# Patient Record
Sex: Female | Born: 1980 | Race: Black or African American | Hispanic: No | Marital: Married | Smoking: Former smoker
Health system: Southern US, Community
[De-identification: ages and names within clinical notes are randomized; demographics above are authoritative.]

## PROBLEM LIST (undated history)

## (undated) ENCOUNTER — Ambulatory Visit: Admission: EM | Payer: Medicaid Other | Source: Home / Self Care

## (undated) DIAGNOSIS — I10 Essential (primary) hypertension: Secondary | ICD-10-CM

---

## 2013-06-30 ENCOUNTER — Emergency Department: Payer: Self-pay | Admitting: Emergency Medicine

## 2013-06-30 LAB — BASIC METABOLIC PANEL
BUN: 16 mg/dL (ref 7–18)
Calcium, Total: 8.7 mg/dL (ref 8.5–10.1)
Co2: 22 mmol/L (ref 21–32)
EGFR (Non-African Amer.): >60
Glucose: 95 mg/dL (ref 65–99)
Potassium: 3 mmol/L — ABNORMAL LOW (ref 3.5–5.1)
Sodium: 142 mmol/L (ref 136–145)

## 2013-06-30 LAB — CBC
HCT: 44.1 % (ref 35.0–47.0)
RBC: 4.95 10*6/uL (ref 3.80–5.20)
RDW: 13.6 % (ref 11.5–14.5)

## 2013-07-09 ENCOUNTER — Emergency Department: Payer: Self-pay | Admitting: Emergency Medicine

## 2013-07-09 LAB — CBC
HCT: 42.9 % (ref 35.0–47.0)
HGB: 14.3 g/dL (ref 12.0–16.0)
Platelet: 296 10*3/uL (ref 150–440)
RBC: 4.81 10*6/uL (ref 3.80–5.20)
RDW: 13.4 % (ref 11.5–14.5)
WBC: 7.6 10*3/uL (ref 3.6–11.0)

## 2013-07-09 LAB — BASIC METABOLIC PANEL
Anion Gap: 7 (ref 7–16)
BUN: 14 mg/dL (ref 7–18)
Calcium, Total: 8.9 mg/dL (ref 8.5–10.1)
Chloride: 109 mmol/L — ABNORMAL HIGH (ref 98–107)
Co2: 24 mmol/L (ref 21–32)
EGFR (African American): >60
Glucose: 100 mg/dL — ABNORMAL HIGH (ref 65–99)
Potassium: 3.4 mmol/L — ABNORMAL LOW (ref 3.5–5.1)
Sodium: 140 mmol/L (ref 136–145)

## 2013-07-09 LAB — URINALYSIS, COMPLETE
Glucose,UR: NEGATIVE mg/dL (ref 0–75)
Ketone: NEGATIVE
Protein: NEGATIVE
Specific Gravity: 1.02 (ref 1.003–1.030)
WBC UR: 14 /HPF (ref 0–5)

## 2013-07-10 LAB — URINE CULTURE

## 2014-02-01 IMAGING — CR DG CHEST 1V PORT
1 series · 1 of 1 positions shown · non-contrast
Comparison: None.

CLINICAL DATA: Palpitations

EXAM:
PORTABLE CHEST - 1 VIEW

[ap]
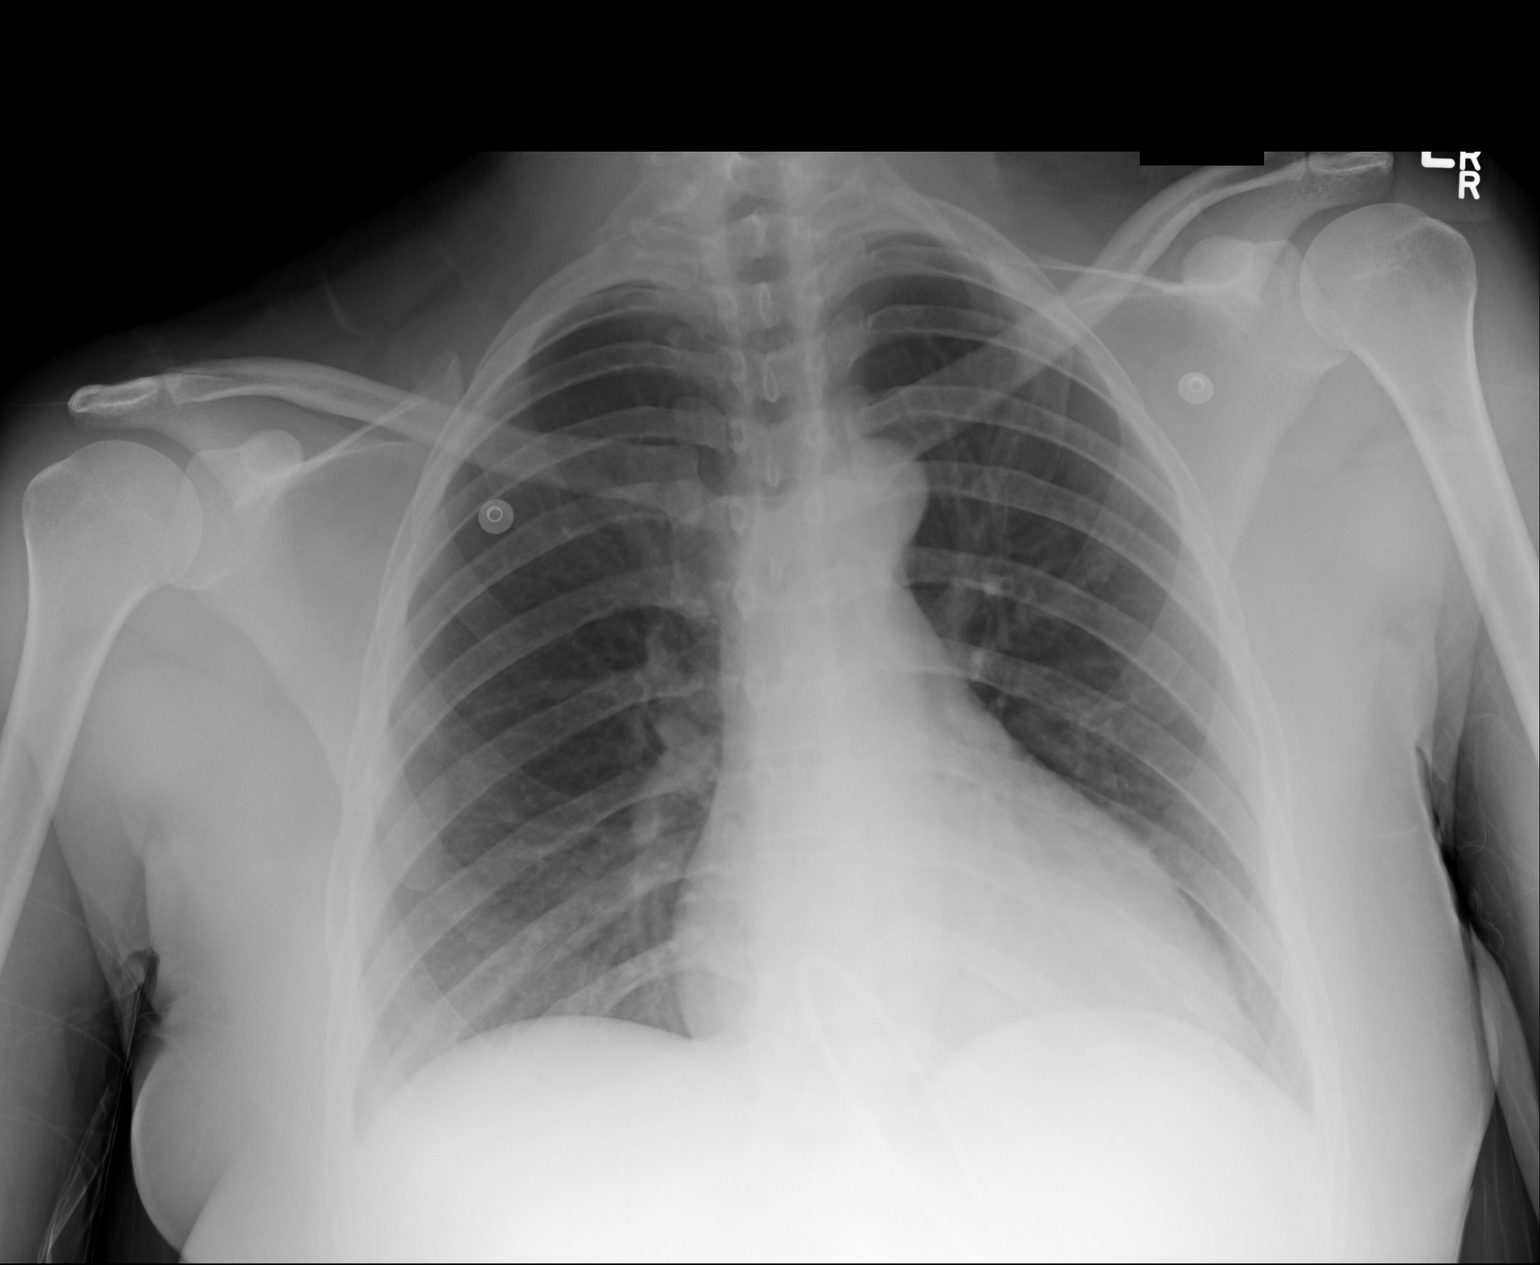

[1 of 1 positions shown; findings below may reference images not displayed]

FINDINGS: The cardiac and mediastinal silhouettes are within normal limits.

The lungs are normally inflated. No airspace consolidation, pleural
effusion, or pulmonary edema is identified. There is no
pneumothorax.

No acute osseous abnormality identified.
IMPRESSION: Normal radiograph of the chest with no acute cardiopulmonary process
identified.

## 2014-02-26 ENCOUNTER — Emergency Department: Payer: Self-pay | Admitting: Emergency Medicine

## 2014-05-12 ENCOUNTER — Emergency Department: Payer: Self-pay | Admitting: Emergency Medicine

## 2014-07-07 ENCOUNTER — Emergency Department: Payer: Self-pay | Admitting: Emergency Medicine

## 2014-08-11 ENCOUNTER — Emergency Department: Payer: Self-pay | Admitting: Emergency Medicine

## 2014-08-11 LAB — CBC WITH DIFFERENTIAL/PLATELET
Basophil #: 0.1 10*3/uL (ref 0.0–0.1)
Basophil %: 0.8 %
EOS ABS: 0.6 10*3/uL (ref 0.0–0.7)
Eosinophil %: 6.4 %
HCT: 44.8 % (ref 35.0–47.0)
HGB: 14.8 g/dL (ref 12.0–16.0)
Lymphocyte #: 4.5 10*3/uL — ABNORMAL HIGH (ref 1.0–3.6)
Lymphocyte %: 49.1 %
MCH: 29.3 pg (ref 26.0–34.0)
MCHC: 33 g/dL (ref 32.0–36.0)
MCV: 89 fL (ref 80–100)
MONO ABS: 0.6 x10 3/mm (ref 0.2–0.9)
Monocyte %: 7 %
NEUTROS PCT: 36.7 %
Neutrophil #: 3.4 10*3/uL (ref 1.4–6.5)
Platelet: 378 10*3/uL (ref 150–440)
RBC: 5.05 10*6/uL (ref 3.80–5.20)
RDW: 13.1 % (ref 11.5–14.5)
WBC: 9.2 10*3/uL (ref 3.6–11.0)

## 2014-08-11 LAB — URINALYSIS, COMPLETE
BILIRUBIN, UR: NEGATIVE
Bacteria: NONE SEEN
Blood: NEGATIVE
Glucose,UR: NEGATIVE mg/dL (ref 0–75)
KETONE: NEGATIVE
Leukocyte Esterase: NEGATIVE
Nitrite: NEGATIVE
PROTEIN: NEGATIVE
Ph: 5 (ref 4.5–8.0)
RBC,UR: <1 /HPF (ref 0–5)
Specific Gravity: 1.023 (ref 1.003–1.030)
Squamous Epithelial: 3

## 2014-08-11 LAB — COMPREHENSIVE METABOLIC PANEL
ALBUMIN: 3.9 g/dL (ref 3.4–5.0)
ALT: 17 U/L (ref 14–63)
AST: 21 U/L (ref 15–37)
Alkaline Phosphatase: 61 U/L (ref 46–116)
Anion Gap: 6 — ABNORMAL LOW (ref 7–16)
BUN: 11 mg/dL (ref 7–18)
Bilirubin,Total: 0.4 mg/dL (ref 0.2–1.0)
Calcium, Total: 9.7 mg/dL (ref 8.5–10.1)
Chloride: 101 mmol/L (ref 98–107)
Co2: 31 mmol/L (ref 21–32)
Creatinine: 0.92 mg/dL (ref 0.60–1.30)
EGFR (African American): >60
EGFR (Non-African Amer.): >60
Glucose: 100 mg/dL — ABNORMAL HIGH (ref 65–99)
Osmolality: 275 (ref 275–301)
Potassium: 2.8 mmol/L — ABNORMAL LOW (ref 3.5–5.1)
Sodium: 138 mmol/L (ref 136–145)
Total Protein: 8.6 g/dL — ABNORMAL HIGH (ref 6.4–8.2)

## 2014-08-11 LAB — DRUG SCREEN, URINE
AMPHETAMINES, UR SCREEN: NEGATIVE (ref ?–1000)
Barbiturates, Ur Screen: NEGATIVE (ref ?–200)
Benzodiazepine, Ur Scrn: NEGATIVE (ref ?–200)
COCAINE METABOLITE, UR ~~LOC~~: NEGATIVE (ref ?–300)
Cannabinoid 50 Ng, Ur ~~LOC~~: NEGATIVE (ref ?–50)
MDMA (ECSTASY) UR SCREEN: NEGATIVE (ref ?–500)
METHADONE, UR SCREEN: NEGATIVE (ref ?–300)
OPIATE, UR SCREEN: NEGATIVE (ref ?–300)
Phencyclidine (PCP) Ur S: NEGATIVE (ref ?–25)
Tricyclic, Ur Screen: NEGATIVE (ref ?–1000)

## 2014-08-11 LAB — CK TOTAL AND CKMB (NOT AT ARMC)
CK, Total: 135 U/L (ref 26–192)
CK-MB: <0.5 ng/mL — ABNORMAL LOW (ref 0.5–3.6)

## 2014-08-11 LAB — TROPONIN I

## 2014-08-11 LAB — TSH: Thyroid Stimulating Horm: 4.19 u[IU]/mL

## 2014-12-31 ENCOUNTER — Encounter: Payer: Self-pay | Admitting: Emergency Medicine

## 2014-12-31 ENCOUNTER — Emergency Department: Payer: Medicaid Other

## 2014-12-31 ENCOUNTER — Emergency Department
Admission: EM | Admit: 2014-12-31 | Discharge: 2014-12-31 | Disposition: A | Discharge status: Home / Self Care | Payer: Medicaid Other | Attending: Emergency Medicine | Admitting: Emergency Medicine

## 2014-12-31 DIAGNOSIS — O10011 Pre-existing essential hypertension complicating pregnancy, first trimester: Secondary | ICD-10-CM | POA: Diagnosis not present

## 2014-12-31 DIAGNOSIS — O9989 Other specified diseases and conditions complicating pregnancy, childbirth and the puerperium: Secondary | ICD-10-CM | POA: Diagnosis present

## 2014-12-31 DIAGNOSIS — R197 Diarrhea, unspecified: Secondary | ICD-10-CM | POA: Insufficient documentation

## 2014-12-31 DIAGNOSIS — Z880 Allergy status to penicillin: Secondary | ICD-10-CM | POA: Insufficient documentation

## 2014-12-31 DIAGNOSIS — Z3490 Encounter for supervision of normal pregnancy, unspecified, unspecified trimester: Secondary | ICD-10-CM

## 2014-12-31 DIAGNOSIS — Z3A01 Less than 8 weeks gestation of pregnancy: Secondary | ICD-10-CM | POA: Diagnosis not present

## 2014-12-31 DIAGNOSIS — R1030 Lower abdominal pain, unspecified: Secondary | ICD-10-CM | POA: Diagnosis not present

## 2014-12-31 HISTORY — DX: Essential (primary) hypertension: I10

## 2014-12-31 LAB — CBC WITH DIFFERENTIAL/PLATELET
Basophils Absolute: 0 10*3/uL (ref 0–0.1)
Basophils Relative: 1 %
Eosinophils Absolute: 0.2 10*3/uL (ref 0–0.7)
Eosinophils Relative: 3 %
HEMATOCRIT: 42.9 % (ref 35.0–47.0)
HEMOGLOBIN: 14.2 g/dL (ref 12.0–16.0)
Lymphocytes Relative: 39 %
Lymphs Abs: 2.5 10*3/uL (ref 1.0–3.6)
MCH: 29.8 pg (ref 26.0–34.0)
MCHC: 33 g/dL (ref 32.0–36.0)
MCV: 90.4 fL (ref 80.0–100.0)
Monocytes Absolute: 0.5 10*3/uL (ref 0.2–0.9)
Monocytes Relative: 7 %
NEUTROS ABS: 3.2 10*3/uL (ref 1.4–6.5)
Neutrophils Relative %: 50 %
Platelets: 318 10*3/uL (ref 150–440)
RBC: 4.75 MIL/uL (ref 3.80–5.20)
RDW: 13.8 % (ref 11.5–14.5)
WBC: 6.4 10*3/uL (ref 3.6–11.0)

## 2014-12-31 LAB — COMPREHENSIVE METABOLIC PANEL
ALT: 12 U/L — ABNORMAL LOW (ref 14–54)
AST: 16 U/L (ref 15–41)
Albumin: 4 g/dL (ref 3.5–5.0)
Alkaline Phosphatase: 37 U/L — ABNORMAL LOW (ref 38–126)
Anion gap: 6 (ref 5–15)
BILIRUBIN TOTAL: 0.7 mg/dL (ref 0.3–1.2)
BUN: 11 mg/dL (ref 6–20)
CALCIUM: 8.9 mg/dL (ref 8.9–10.3)
CO2: 24 mmol/L (ref 22–32)
Chloride: 107 mmol/L (ref 101–111)
Creatinine, Ser: 0.71 mg/dL (ref 0.44–1.00)
GFR calc Af Amer: >60 mL/min (ref >60)
GFR calc non Af Amer: >60 mL/min (ref >60)
Glucose, Bld: 97 mg/dL (ref 65–99)
Potassium: 3.4 mmol/L — ABNORMAL LOW (ref 3.5–5.1)
SODIUM: 137 mmol/L (ref 135–145)
Total Protein: 7.6 g/dL (ref 6.5–8.1)

## 2014-12-31 LAB — POCT PREGNANCY, URINE: PREG TEST UR: POSITIVE — AB

## 2014-12-31 LAB — HCG, QUANTITATIVE, PREGNANCY: hCG, Beta Chain, Quant, S: 134 m[IU]/mL — ABNORMAL HIGH (ref <5)

## 2014-12-31 NOTE — Discharge Instructions (Signed)
Abdominal Pain During Pregnancy Abdominal pain is common in pregnancy. Most of the time, it does not cause harm. There are many causes of abdominal pain. Some causes are more serious than others. Some of the causes of abdominal pain in pregnancy are easily diagnosed. Occasionally, the diagnosis takes time to understand. Other times, the cause is not determined. Abdominal pain can be a sign that something is very wrong with the pregnancy, or the pain may have nothing to do with the pregnancy at all. For this reason, always tell your health care provider if you have any abdominal discomfort. HOME CARE INSTRUCTIONS  Monitor your abdominal pain for any changes. The following actions may help to alleviate any discomfort you are experiencing:  Do not have sexual intercourse or put anything in your vagina until your symptoms go away completely.  Get plenty of rest until your pain improves.  Drink clear fluids if you feel nauseous. Avoid solid food as long as you are uncomfortable or nauseous.  Only take over-the-counter or prescription medicine as directed by your health care provider.  Keep all follow-up appointments with your health care provider. SEEK IMMEDIATE MEDICAL CARE IF:  You are bleeding, leaking fluid, or passing tissue from the vagina.  You have increasing pain or cramping.  You have persistent vomiting.  You have painful or bloody urination.  You have a fever.  You notice a decrease in your baby's movements.  You have extreme weakness or feel faint.  You have shortness of breath, with or without abdominal pain.  You develop a severe headache with abdominal pain.  You have abnormal vaginal discharge with abdominal pain.  You have persistent diarrhea.  You have abdominal pain that continues even after rest, or gets worse. MAKE SURE YOU:   Understand these instructions.  Will watch your condition.  Will get help right away if you are not doing well or get  worse. Document Released: 06/30/2005 Document Revised: 04/20/2013 Document Reviewed: 01/27/2013 [facility] Patient Information 2015 [place], [place]. This information is not intended to replace advice given to you by your health care provider. Make sure you discuss any questions you have with your health care provider.      As we have discussed your pregnancy test has resulted as positive, but her ultrasound does not show a pregnancy currently. This could be either a very early normal pregnancy, a miscarriage or possibly an ectopic pregnancy. It is extremely important he follow-up with your OB/GYN within 1-2 weeks for recheck and re-ultrasound. Please return to the emergency department for any increased pain, bleeding, or any other personally concerning symptoms.

## 2014-12-31 NOTE — ED Provider Notes (Signed)
[facility] Emergency Department Provider Note  Time seen: 10:12 AM  I have reviewed the triage vital signs and the nursing notes.   HISTORY  Chief Complaint Pelvic Pain    HPI Christine Young is a 34 y.o. female with a past medical history of hypertension presents the emergency department with lower abdominal pain since this morning. According to the patient she's had lower abdominal pain, on both sides of her lower belly since 7 AM this morning. She also states intermittent diarrhea for the past 2 days. Denies any bloody or black diarrhea area denies nausea or vomiting. Denies fever. Patient does state her last period was at the beginning of May and she is about 2 weeks late at this point.Denies any vaginal bleeding, or vaginal discharge. Patient states her pain is mild/moderate in severity, no modifying factors identified.     Past Medical History  Diagnosis Date  . Hypertension     There are no active problems to display for this patient.   History reviewed. No pertinent past surgical history.  No current outpatient prescriptions on file.  Allergies Penicillins  No family history on file.  Social History History  Substance Use Topics  . Smoking status: Never Smoker   . Smokeless tobacco: Not on file  . Alcohol Use: Yes     Comment: occasionally    Review of Systems Constitutional: Negative for fever. Cardiovascular: Negative for chest pain. Respiratory: Negative for shortness of breath. Gastrointestinal: Positive for lower abdominal pain. Negative for nausea/vomiting. Positive for diarrhea 2 days. Genitourinary: Negative for dysuria. Negative for vaginal bleeding or discharge. Musculoskeletal: Negative for back pain.  10-point ROS otherwise negative.  ____________________________________________   PHYSICAL EXAM:  VITAL SIGNS: ED Triage Vitals  Enc Vitals Group     BP 12/31/14 0859 146/83 mmHg     Pulse Rate 12/31/14 0859 72    Resp 12/31/14 0859 20     Temp 12/31/14 0859 98.8 F (37.1 C)     Temp Source 12/31/14 0859 Oral     SpO2 12/31/14 0859 99 %     Weight 12/31/14 0859 192 lb (87.091 kg)     Height 12/31/14 0859  (1.702 m)     Head Cir --      Peak Flow --      Pain Score 12/31/14 0910 7     Pain Loc --      Pain Edu? --      Excl. in GC? --     Constitutional: Alert and oriented. Well appearing  ENT   Head: Normocephalic and atraumatic.   Mouth/Throat: Mucous membranes are moist. Cardiovascular: Normal rate, regular rhythm. No murmur Respiratory: Normal respiratory effort without tachypnea nor retractions. Breath sounds are clear  Gastrointestinal: Mild suprapubic tenderness to palpation, no rebound or guarding. Otherwise benign abdominal exam. Musculoskeletal: Nontender with normal range of motion in all extremities Neurologic:  Normal speech and language. No gross focal neurologic deficits  Skin:  Skin is warm, dry and intact.  Psychiatric: Mood and affect are normal. Speech and behavior are normal.   ____________________________________________     RADIOLOGY  Ultrasound shows no IUP.  ____________________________________________    INITIAL IMPRESSION / ASSESSMENT AND PLAN / ED COURSE  Pertinent labs & imaging results that were available during my care of the patient were reviewed by me and considered in my medical decision making (see chart for details).  Patient with positive point of care pregnancy test. We will send a quantitative  hCG to confirm. We will obtain an ultrasound to rule out ectopic pregnancy. Currently awaiting other lab results at this time.  Patient with a beta hCG in the 100s. Ultrasound shows no IUP. Differential would include miscarriage, versus early intrauterine pregnancy, versus ectopic pregnancy. Patient needs to follow up with OB/GYN within the next 1-2 weeks. I discussed the plan of care with the patient is agreeable to plan. Given very strict  return precautions which she is agreeable.  ____________________________________________   FINAL CLINICAL IMPRESSION(S) / ED DIAGNOSES  Lower abdominal pain First trimester pregnancy   Minna Antis, MD 12/31/14 1310

## 2014-12-31 NOTE — ED Notes (Signed)
NAD noted at time of D/C. Pt denies questions or concerns. Pt ambulatory to the lobby at this time.  

## 2014-12-31 NOTE — ED Notes (Signed)
Patient presents to the ED with bilateral lower abdominal pain/pelvic pain that started at 7am.  Patient describes pain as sharp and constant.  Patient denies dysuria and denies vaginal bleeding.  Patient reports nausea but denies vomiting and diarrhea.  Pressure to area relieves pain.

## 2015-01-24 ENCOUNTER — Emergency Department
Admission: EM | Admit: 2015-01-24 | Discharge: 2015-01-24 | Disposition: A | Discharge status: Home / Self Care | Payer: Medicaid Other | Attending: Emergency Medicine | Admitting: Emergency Medicine

## 2015-01-24 ENCOUNTER — Encounter: Payer: Self-pay | Admitting: Emergency Medicine

## 2015-01-24 ENCOUNTER — Emergency Department: Payer: Medicaid Other

## 2015-01-24 DIAGNOSIS — O99611 Diseases of the digestive system complicating pregnancy, first trimester: Secondary | ICD-10-CM | POA: Insufficient documentation

## 2015-01-24 DIAGNOSIS — Z3A01 Less than 8 weeks gestation of pregnancy: Secondary | ICD-10-CM | POA: Insufficient documentation

## 2015-01-24 DIAGNOSIS — R109 Unspecified abdominal pain: Secondary | ICD-10-CM

## 2015-01-24 DIAGNOSIS — O3481 Maternal care for other abnormalities of pelvic organs, first trimester: Secondary | ICD-10-CM | POA: Diagnosis not present

## 2015-01-24 DIAGNOSIS — O10011 Pre-existing essential hypertension complicating pregnancy, first trimester: Secondary | ICD-10-CM | POA: Insufficient documentation

## 2015-01-24 DIAGNOSIS — K219 Gastro-esophageal reflux disease without esophagitis: Secondary | ICD-10-CM | POA: Insufficient documentation

## 2015-01-24 DIAGNOSIS — O9989 Other specified diseases and conditions complicating pregnancy, childbirth and the puerperium: Secondary | ICD-10-CM | POA: Diagnosis present

## 2015-01-24 DIAGNOSIS — O26899 Other specified pregnancy related conditions, unspecified trimester: Secondary | ICD-10-CM

## 2015-01-24 DIAGNOSIS — Z880 Allergy status to penicillin: Secondary | ICD-10-CM | POA: Insufficient documentation

## 2015-01-24 DIAGNOSIS — N83202 Unspecified ovarian cyst, left side: Secondary | ICD-10-CM

## 2015-01-24 LAB — URINALYSIS COMPLETE WITH MICROSCOPIC (ARMC ONLY)
Bilirubin Urine: NEGATIVE
Glucose, UA: NEGATIVE mg/dL
HGB URINE DIPSTICK: NEGATIVE
Ketones, ur: NEGATIVE mg/dL
Leukocytes, UA: NEGATIVE
NITRITE: NEGATIVE
PH: 8 (ref 5.0–8.0)
Protein, ur: NEGATIVE mg/dL
RBC / HPF: NONE SEEN RBC/hpf (ref 0–5)
Specific Gravity, Urine: 1.017 (ref 1.005–1.030)

## 2015-01-24 LAB — COMPREHENSIVE METABOLIC PANEL
ALBUMIN: 3.9 g/dL (ref 3.5–5.0)
ALT: 14 U/L (ref 14–54)
ANION GAP: 6 (ref 5–15)
AST: 16 U/L (ref 15–41)
Alkaline Phosphatase: 37 U/L — ABNORMAL LOW (ref 38–126)
BILIRUBIN TOTAL: 0.6 mg/dL (ref 0.3–1.2)
BUN: 8 mg/dL (ref 6–20)
CO2: 25 mmol/L (ref 22–32)
CREATININE: 0.69 mg/dL (ref 0.44–1.00)
Calcium: 9.2 mg/dL (ref 8.9–10.3)
Chloride: 104 mmol/L (ref 101–111)
Glucose, Bld: 83 mg/dL (ref 65–99)
Potassium: 3.8 mmol/L (ref 3.5–5.1)
Sodium: 135 mmol/L (ref 135–145)
Total Protein: 7.7 g/dL (ref 6.5–8.1)

## 2015-01-24 LAB — HCG, QUANTITATIVE, PREGNANCY: HCG, BETA CHAIN, QUANT, S: 102779 m[IU]/mL — AB (ref <5)

## 2015-01-24 LAB — LIPASE, BLOOD: Lipase: 56 U/L — ABNORMAL HIGH (ref 22–51)

## 2015-01-24 LAB — CBC
HCT: 41.1 % (ref 35.0–47.0)
Hemoglobin: 13.8 g/dL (ref 12.0–16.0)
MCH: 29.3 pg (ref 26.0–34.0)
MCHC: 33.4 g/dL (ref 32.0–36.0)
MCV: 87.7 fL (ref 80.0–100.0)
Platelets: 282 10*3/uL (ref 150–440)
RBC: 4.69 MIL/uL (ref 3.80–5.20)
RDW: 13.1 % (ref 11.5–14.5)
WBC: 7.3 10*3/uL (ref 3.6–11.0)

## 2015-01-24 MED ORDER — FAMOTIDINE 20 MG PO TABS: 20.0000 mg | ORAL_TABLET | Freq: Every day | ORAL | Status: DC

## 2015-01-24 MED ORDER — ONDANSETRON 4 MG PO TBDP
ORAL_TABLET | ORAL | Status: AC
  Filled 2015-01-24: qty 1

## 2015-01-24 MED ORDER — FAMOTIDINE 20 MG PO TABS
20.0000 mg | ORAL_TABLET | Freq: Once | ORAL | Status: AC
  Administered 2015-01-24: 20 mg via ORAL
  Filled 2015-01-24 (×2): qty 1

## 2015-01-24 MED ORDER — ONDANSETRON 4 MG PO TBDP
4.0000 mg | ORAL_TABLET | Freq: Once | ORAL | Status: AC | PRN
  Administered 2015-01-24: 4 mg via ORAL

## 2015-01-24 MED ORDER — ACETAMINOPHEN 500 MG PO TABS
1000.0000 mg | ORAL_TABLET | Freq: Once | ORAL | Status: AC
  Administered 2015-01-24: 1000 mg via ORAL
  Filled 2015-01-24: qty 2

## 2015-01-24 MED ORDER — ONDANSETRON 4 MG PO TBDP
4.0000 mg | ORAL_TABLET | Freq: Once | ORAL | Status: AC
  Administered 2015-01-24: 4 mg via ORAL
  Filled 2015-01-24: qty 1

## 2015-01-24 MED ORDER — ONDANSETRON 4 MG PO TBDP: 4.0000 mg | ORAL_TABLET | Freq: Three times a day (TID) | ORAL | Status: DC | PRN

## 2015-01-24 NOTE — ED Notes (Signed)
C/o abd pain with dry heaving.  Py unable to food down.

## 2015-01-24 NOTE — Discharge Instructions (Signed)
°Abdominal Pain During Pregnancy °Belly (abdominal) pain is common during pregnancy. Most of the time, it is not a serious problem. Other times, it can be a sign that something is wrong with the pregnancy. Always tell your doctor if you have belly pain. °HOME CARE °Monitor your belly pain for any changes. The following actions may help you feel better: °· Do not have sex (intercourse) or put anything in your vagina until you feel better. °· Rest until your pain stops. °· Drink clear fluids if you feel sick to your stomach (nauseous). Do not eat solid food until you feel better. °· Only take medicine as told by your doctor. °· Keep all doctor visits as told. °GET HELP RIGHT AWAY IF:  °· You are bleeding, leaking fluid, or pieces of tissue come out of your vagina. °· You have more pain or cramping. °· You keep throwing up (vomiting). °· You have pain when you pee (urinate) or have blood in your pee. °· You have a fever. °· You do not feel your baby moving as much. °· You feel very weak or feel like passing out. °· You have trouble breathing, with or without belly pain. °· You have a very bad headache and belly pain. °· You have fluid leaking from your vagina and belly pain. °· You keep having watery poop (diarrhea). °· Your belly pain does not go away after resting, or the pain gets worse. °MAKE SURE YOU:  °· Understand these instructions. °· Will watch your condition. °· Will get help right away if you are not doing well or get worse. °Document Released: 06/18/2009 Document Revised: 03/02/2013 Document Reviewed: 01/27/2013 °[employer]® Patient Information ©2015 [employer]. This information is not intended to replace advice given to you by your health care provider. Make sure you discuss any questions you have with your health care provider. °Gastroesophageal Reflux Disease, Adult °Gastroesophageal reflux disease (GERD) happens when acid from your stomach flows up into the esophagus. When acid comes in contact with  the esophagus, the acid causes soreness (inflammation) in the esophagus. Over time, GERD may create small holes (ulcers) in the lining of the esophagus. °CAUSES  °· Increased body weight. This puts pressure on the stomach, making acid rise from the stomach into the esophagus. °· Smoking. This increases acid production in the stomach. °· Drinking alcohol. This causes decreased pressure in the lower esophageal sphincter (valve or ring of muscle between the esophagus and stomach), allowing acid from the stomach into the esophagus. °· Late evening meals and a full stomach. This increases pressure and acid production in the stomach. °· A malformed lower esophageal sphincter. °Sometimes, no cause is found. °SYMPTOMS  °· Burning pain in the lower part of the mid-chest behind the breastbone and in the mid-stomach area. This may occur twice a week or more often. °· Trouble swallowing. °· Sore throat. °· Dry cough. °· Asthma-like symptoms including chest tightness, shortness of breath, or wheezing. °DIAGNOSIS  °Your caregiver may be able to diagnose GERD based on your symptoms. In some cases, X-rays and other tests may be done to check for complications or to check the condition of your stomach and esophagus. °TREATMENT  °Your caregiver may recommend over-the-counter or prescription medicines to help decrease acid production. Ask your caregiver before starting or adding any new medicines.  °HOME CARE INSTRUCTIONS  °· Change the factors that you can control. Ask your caregiver for guidance concerning weight loss, quitting smoking, and alcohol consumption. °· Avoid foods and drinks that   make your symptoms worse, such as: °· Caffeine or alcoholic drinks. °· Chocolate. °· Peppermint or mint flavorings. °· Garlic and onions. °· Spicy foods. °· Citrus fruits, such as oranges, lemons, or limes. °· Tomato-based foods such as sauce, chili, salsa, and pizza. °· Fried and fatty foods. °· Avoid lying down for the 3 hours prior to your  bedtime or prior to taking a nap. °· Eat small, frequent meals instead of large meals. °· Wear loose-fitting clothing. Do not wear anything tight around your waist that causes pressure on your stomach. °· Raise the head of your bed 6 to 8 inches with wood blocks to help you sleep. Extra pillows will not help. °· Only take over-the-counter or prescription medicines for pain, discomfort, or fever as directed by your caregiver. °· Do not take aspirin, ibuprofen, or other nonsteroidal anti-inflammatory drugs (NSAIDs). °SEEK IMMEDIATE MEDICAL CARE IF:  °· You have pain in your arms, neck, jaw, teeth, or back. °· Your pain increases or changes in intensity or duration. °· You develop nausea, vomiting, or sweating (diaphoresis). °· You develop shortness of breath, or you faint. °· Your vomit is green, yellow, black, or looks like coffee grounds or blood. °· Your stool is red, bloody, or black. °These symptoms could be signs of other problems, such as heart disease, gastric bleeding, or esophageal bleeding. °MAKE SURE YOU:  °· Understand these instructions. °· Will watch your condition. °· Will get help right away if you are not doing well or get worse. °Document Released: 04/09/2005 Document Revised: 09/22/2011 Document Reviewed: 01/17/2011 °[employer]® Patient Information ©2015 [employer]. This information is not intended to replace advice given to you by your health care provider. Make sure you discuss any questions you have with your health care provider. ° °

## 2015-01-24 NOTE — ED Provider Notes (Signed)
[facility] Emergency Department Provider Note     Time seen: ----------------------------------------- 2:05 PM on 01/24/2015 -----------------------------------------    I have reviewed the triage vital signs and the nursing notes.   HISTORY  Chief Complaint Abdominal Pain    HPI Christine Young is a 34 y.o. female who presents ER for abdominal pain and dry heaving. Patient's been examined unable to keep any food down. Reportedly she is in the first trimester pregnancy, this is her seventh pregnancy, has 6 normal children. Has not had upper abdominal pain that is sharp with any pregnancy. Pain is mild to moderate worse with vomiting. Patient states that she hasn't had any vaginal bleeding or discharge, was seen here about a month ago and at all sound showed she was probably [redacted] weeks along.   Past Medical History  Diagnosis Date  . Hypertension     There are no active problems to display for this patient.   History reviewed. No pertinent past surgical history.  Allergies Penicillins  Social History History  Substance Use Topics  . Smoking status: Never Smoker   . Smokeless tobacco: Not on file  . Alcohol Use: Yes     Comment: occasionally    Review of Systems Constitutional: Negative for fever. Eyes: Negative for visual changes. ENT: Negative for sore throat. Cardiovascular: Negative for chest pain. Respiratory: Negative for shortness of breath. Gastrointestinal: Positive for abdominal pain and vomiting Genitourinary: Negative for dysuria. Musculoskeletal: Negative for back pain. Skin: Negative for rash. Neurological: Negative for headaches, focal weakness or numbness.  10-point ROS otherwise negative.  ____________________________________________   PHYSICAL EXAM:  VITAL SIGNS: ED Triage Vitals  Enc Vitals Group     BP 01/24/15 1145 143/95 mmHg     Pulse Rate 01/24/15 1145 75     Resp 01/24/15 1145 15     Temp --      Temp  Source 01/24/15 1145 Oral     SpO2 01/24/15 1145 100 %     Weight --      Height --      Head Cir --      Peak Flow --      Pain Score 01/24/15 1145 4     Pain Loc --      Pain Edu? --      Excl. in GC? --    Acute distress Constitutional: Alert and oriented. Well appearing and in no distress. Eyes: Conjunctivae are normal. PERRL. Normal extraocular movements. ENT   Head: Normocephalic and atraumatic.   Nose: No congestion/rhinnorhea.   Mouth/Throat: Mucous membranes are moist.   Neck: No stridor. Hematological/Lymphatic/Immunilogical: No cervical lymphadenopathy. Cardiovascular: Normal rate, regular rhythm. Normal and symmetric distal pulses are present in all extremities. No murmurs, rubs, or gallops. Respiratory: Normal respiratory effort without tachypnea nor retractions. Breath sounds are clear and equal bilaterally. No wheezes/rales/rhonchi. Gastrointestinal: Soft and nontender. No distention. No abdominal bruits. There is no CVA tenderness. Musculoskeletal: Nontender with normal range of motion in all extremities. No joint effusions.  No lower extremity tenderness nor edema. Neurologic:  Normal speech and language. No gross focal neurologic deficits are appreciated. Speech is normal. No gait instability. Skin:  Skin is warm, dry and intact. No rash noted. Psychiatric: Mood and affect are normal. Speech and behavior are normal. Patient exhibits appropriate insight and judgment. ____________________________________________  ED COURSE:  Pertinent labs & imaging results that were available during my care of the patient were reviewed by me and considered in my medical  decision making (see chart for details). Patient is no acute distress, is being given oral Zofran, Tylenol, Zantac. We'll do an ultrasound to evaluate for normal intrauterine pregnancy ____________________________________________    LABS (pertinent positives/negatives)  Labs Reviewed  LIPASE, BLOOD -  Abnormal; Notable for the following:    Lipase 56 (*)    All other components within normal limits  COMPREHENSIVE METABOLIC PANEL - Abnormal; Notable for the following:    Alkaline Phosphatase 37 (*)    All other components within normal limits  HCG, QUANTITATIVE, PREGNANCY - Abnormal; Notable for the following:    hCG, Beta Chain, Quant, S [id number] (*)    All other components within normal limits  URINALYSIS COMPLETEWITH MICROSCOPIC ([facility] ONLY) - Abnormal; Notable for the following:    Color, Urine YELLOW (*)    APPearance CLOUDY (*)    Bacteria, UA RARE (*)    Squamous Epithelial / LPF 6-30 (*)    All other components within normal limits  CBC    RADIOLOGY  Pregnancy ultrasound 7 week 4 day IUP, left ovarian cyst. ____________________________________________  FINAL ASSESSMENT AND PLAN  Abdominal pain and pregnancy, vomiting, GERD, ovarian cyst  Plan: Patient given Zofran, Zantac and Tylenol here. She'll continue home on similar. I feel like her symptoms are likely secondary to hormonal changes in pregnancies and the effect on her GI tract. She'll continue on these medications at home. She is referred to OB/GYN for follow-up. Ultrasound findings as dictated above.   Emily FilbertWilliams, Jonathan E, MD   Emily FilbertJonathan E Williams, MD 01/24/15 [phone]

## 2015-11-01 ENCOUNTER — Emergency Department: Payer: Medicaid Other

## 2015-11-01 ENCOUNTER — Encounter: Payer: Self-pay | Admitting: Emergency Medicine

## 2015-11-01 ENCOUNTER — Emergency Department
Admission: EM | Admit: 2015-11-01 | Discharge: 2015-11-01 | Disposition: A | Discharge status: Home / Self Care | Payer: Medicaid Other | Attending: Emergency Medicine | Admitting: Emergency Medicine

## 2015-11-01 DIAGNOSIS — R0602 Shortness of breath: Secondary | ICD-10-CM | POA: Diagnosis present

## 2015-11-01 DIAGNOSIS — R002 Palpitations: Secondary | ICD-10-CM | POA: Insufficient documentation

## 2015-11-01 DIAGNOSIS — I10 Essential (primary) hypertension: Secondary | ICD-10-CM | POA: Insufficient documentation

## 2015-11-01 DIAGNOSIS — Z79899 Other long term (current) drug therapy: Secondary | ICD-10-CM | POA: Insufficient documentation

## 2015-11-01 LAB — COMPREHENSIVE METABOLIC PANEL
ALT: 14 U/L (ref 14–54)
AST: 20 U/L (ref 15–41)
Albumin: 4.5 g/dL (ref 3.5–5.0)
Alkaline Phosphatase: 48 U/L (ref 38–126)
Anion gap: 9 (ref 5–15)
BUN: 13 mg/dL (ref 6–20)
CHLORIDE: 105 mmol/L (ref 101–111)
CO2: 26 mmol/L (ref 22–32)
CREATININE: 0.75 mg/dL (ref 0.44–1.00)
Calcium: 9.7 mg/dL (ref 8.9–10.3)
GFR calc non Af Amer: >60 mL/min (ref >60)
Glucose, Bld: 105 mg/dL — ABNORMAL HIGH (ref 65–99)
POTASSIUM: 3.7 mmol/L (ref 3.5–5.1)
Sodium: 140 mmol/L (ref 135–145)
Total Bilirubin: 0.7 mg/dL (ref 0.3–1.2)
Total Protein: 8.2 g/dL — ABNORMAL HIGH (ref 6.5–8.1)

## 2015-11-01 LAB — CBC
HCT: 43.1 % (ref 35.0–47.0)
Hemoglobin: 14.4 g/dL (ref 12.0–16.0)
MCH: 29.7 pg (ref 26.0–34.0)
MCHC: 33.3 g/dL (ref 32.0–36.0)
MCV: 89.1 fL (ref 80.0–100.0)
PLATELETS: 273 10*3/uL (ref 150–440)
RBC: 4.84 MIL/uL (ref 3.80–5.20)
RDW: 13.4 % (ref 11.5–14.5)
WBC: 7.8 10*3/uL (ref 3.6–11.0)

## 2015-11-01 LAB — TSH: TSH: 2.674 u[IU]/mL (ref 0.350–4.500)

## 2015-11-01 LAB — POCT PREGNANCY, URINE: PREG TEST UR: NEGATIVE

## 2015-11-01 LAB — TROPONIN I: Troponin I: <0.03 ng/mL (ref <0.031)

## 2015-11-01 LAB — FIBRIN DERIVATIVES D-DIMER (ARMC ONLY): Fibrin derivatives D-dimer (ARMC): 237 (ref 0–499)

## 2015-11-01 NOTE — ED Notes (Signed)
Pt noted to be sitting on bench on her phone at this time. Pt given urine cup and this RN requested a sample. This RN then explained to patient that I would like for her to be hooked back up to the monitor for safety reasons [facility]. Pt agreeable at this time, pt up to go to the bathroom with no assistance needed. Pt's friend noted to be asleep in the bed at this time.

## 2015-11-01 NOTE — ED Notes (Signed)
Pt reports "feeling jittery" and "tired" x3 days. Pt states when she tries to lay down, "I feel like heart palpitations or something". Pt reports some nausea, but denies vomiting.

## 2015-11-01 NOTE — ED Provider Notes (Signed)
Patient's d-dimer less than 500, patient feels well requesting to go home.  Pointing care negative for pregnancy. TSH normal.  Patient appears stable, ready for discharge. Discharge instructions provided.  Sharyn CreamerMark Quale, MD 11/01/15 [phone]

## 2015-11-01 NOTE — ED Provider Notes (Signed)
[facility] Emergency Department Provider Note  ____________________________________________  Time seen: 6:15 AM  I have reviewed the triage vital signs and the nursing notes.   HISTORY  Chief Complaint Shortness of Breath     HPI Christine Young is a 35 y.o. female with history of hypertension presents with intermittent dyspnea and "heart fluttering 3 days. Patient denies any pain no nausea or vomiting no dizziness or diaphoresis. Denies any previous cardiac disease. Patient does state that her mother have a pacemaker.     Past Medical History  Diagnosis Date  . Hypertension     There are no active problems to display for this patient.   No past surgical history on file.  Current Outpatient Rx  Name  Route  Sig  Dispense  Refill  . famotidine (PEPCID) 20 MG tablet   Oral   Take 1 tablet (20 mg total) by mouth daily.   30 tablet   1   . ondansetron (ZOFRAN ODT) 4 MG disintegrating tablet   Oral   Take 1 tablet (4 mg total) by mouth every 8 (eight) hours as needed for nausea or vomiting.   20 tablet   1     Allergies Penicillins  No family history on file.  Social History Social History  Substance Use Topics  . Smoking status: Never Smoker   . Smokeless tobacco: Not on file  . Alcohol Use: Yes     Comment: occasionally    Review of Systems  Constitutional: Negative for fever. Eyes: Negative for visual changes. ENT: Negative for sore throat. Cardiovascular: Negative for chest pain. Respiratory: Positive for shortness of breath. Gastrointestinal: Negative for abdominal pain, vomiting and diarrhea. Genitourinary: Negative for dysuria. Musculoskeletal: Negative for back pain. Skin: Negative for rash. Neurological: Negative for headaches, focal weakness or numbness.   10-point ROS otherwise negative.  ____________________________________________   PHYSICAL EXAM:  VITAL SIGNS: ED Triage Vitals  Enc Vitals Group     BP  11/01/15 0049 164/107 mmHg     Pulse Rate 11/01/15 0049 100     Resp 11/01/15 0048 18     Temp 11/01/15 0048 98.5 F (36.9 C)     Temp Source 11/01/15 0048 Oral     SpO2 11/01/15 0049 99 %     Weight 11/01/15 0048 186 lb (84.369 kg)     Height 11/01/15 0048 5\' 7"  (1.702 m)     Head Cir --      Peak Flow --      Pain Score 11/01/15 0049 0     Pain Loc --      Pain Edu? --      Excl. in GC? --      Constitutional: Alert and oriented. Well appearing and in no distress. Eyes: Conjunctivae are normal. PERRL. Normal extraocular movements. ENT   Head: Normocephalic and atraumatic.   Nose: No congestion/rhinnorhea.   Mouth/Throat: Mucous membranes are moist.   Neck: No stridor. Hematological/Lymphatic/Immunilogical: No cervical lymphadenopathy. Cardiovascular: Normal rate, regular rhythm. Normal and symmetric distal pulses are present in all extremities. No murmurs, rubs, or gallops. Respiratory: Normal respiratory effort without tachypnea nor retractions. Breath sounds are clear and equal bilaterally. No wheezes/rales/rhonchi. Gastrointestinal: Soft and nontender. No distention. There is no CVA tenderness. Genitourinary: deferred Musculoskeletal: Nontender with normal range of motion in all extremities. No joint effusions.  No lower extremity tenderness nor edema. Neurologic:  Normal speech and language. No gross focal neurologic deficits are appreciated. Speech is normal.  Skin:  Skin is warm, dry and intact. No rash noted. Psychiatric: Mood and affect are normal. Speech and behavior are normal. Patient exhibits appropriate insight and judgment.  ____________________________________________    LABS (pertinent positives/negatives)  Labs Reviewed  COMPREHENSIVE METABOLIC PANEL - Abnormal; Notable for the following:    Glucose, Bld 105 (*)    Total Protein 8.2 (*)    All other components within normal limits  CBC  TROPONIN I  TSH  FIBRIN DERIVATIVES D-DIMER ([facility]  ONLY)   Labs Reviewed  COMPREHENSIVE METABOLIC PANEL - Abnormal; Notable for the following:    Glucose, Bld 105 (*)    Total Protein 8.2 (*)    All other components within normal limits  CBC  TROPONIN I  FIBRIN DERIVATIVES D-DIMER ([facility] ONLY)  TSH  POCT PREGNANCY, URINE     ____________________________________________   EKG  ED ECG REPORT I, [place] Duong Haydel, the attending physician, personally viewed and interpreted this ECG.   Date: 11/01/2015  EKG Time: 12:53 AM  Rate: 97  Rhythm: Normal sinus rhythm  Axis: Normal  Intervals: Normal  ST&T Change: None   ____________________________________________    RADIOLOGY     DG Chest 2 View (Final result) Result time: 11/01/15 06:42:29   Final result by Rad Results In Interface (11/01/15 06:42:29)   Narrative:   CLINICAL DATA: Shortness of breath, dizziness and nausea for 3 days. History of hypertension.  EXAM: CHEST 2 VIEW  COMPARISON: Chest radiograph August 11, 2014  FINDINGS: Cardiomediastinal silhouette is normal. The lungs are clear without pleural effusions or focal consolidations. Trachea projects midline and there is no pneumothorax. Soft tissue planes and included osseous structures are non-suspicious.  IMPRESSION: Normal chest.   Electronically Signed By: Awilda Metro M.D. On: 11/01/2015 06:42          INITIAL IMPRESSION / ASSESSMENT AND PLAN / ED COURSE  Pertinent labs & imaging results that were available during my care of the patient were reviewed by me and considered in my medical decision making (see chart for details).  Patient's EKG did not reveal any irregularity however concern for possible cardiac arrhythmia as such patient will be referred to Dr. Juliann Pares cardiologist on call for possible outpatient Holter monitor placement  ____________________________________________   FINAL CLINICAL IMPRESSION(S) / ED DIAGNOSES  Final diagnoses:  Heart palpitations       Darci Current, MD 11/01/15 2259

## 2015-11-01 NOTE — Discharge Instructions (Signed)

## 2015-11-01 NOTE — ED Notes (Signed)
Pt in with co shob x 3 days with heart fluttering, no recent cold symptoms.

## 2015-11-01 NOTE — ED Notes (Addendum)
NAD noted at time of D/C. Pt noted to be playing on her phone during D/C instructions review. Pt ambulatory out of room to lobby, NAD noted at this time. Respirations even and unlabored, skin warm, dry, and intact. Pt instructed to follow up with Dr. Juliann Pares[place]wood for cardiology consult. Pt denies comments/concerns at this time. Pt did request prescription for anxiety, per MD no prescriptions to be given at this time.

## 2016-01-15 ENCOUNTER — Emergency Department
Admission: EM | Admit: 2016-01-15 | Discharge: 2016-01-15 | Disposition: A | Discharge status: Home / Self Care | Payer: Medicaid Other | Attending: Emergency Medicine | Admitting: Emergency Medicine

## 2016-01-15 ENCOUNTER — Encounter: Payer: Self-pay | Admitting: Urgent Care

## 2016-01-15 ENCOUNTER — Emergency Department: Payer: Medicaid Other

## 2016-01-15 DIAGNOSIS — I10 Essential (primary) hypertension: Secondary | ICD-10-CM | POA: Insufficient documentation

## 2016-01-15 DIAGNOSIS — R079 Chest pain, unspecified: Secondary | ICD-10-CM | POA: Insufficient documentation

## 2016-01-15 DIAGNOSIS — Z5321 Procedure and treatment not carried out due to patient leaving prior to being seen by health care provider: Secondary | ICD-10-CM | POA: Insufficient documentation

## 2016-01-15 DIAGNOSIS — M79602 Pain in left arm: Secondary | ICD-10-CM | POA: Insufficient documentation

## 2016-01-15 LAB — CBC
HCT: 42.6 % (ref 35.0–47.0)
HEMOGLOBIN: 14.7 g/dL (ref 12.0–16.0)
MCH: 30.3 pg (ref 26.0–34.0)
MCHC: 34.4 g/dL (ref 32.0–36.0)
MCV: 88.1 fL (ref 80.0–100.0)
PLATELETS: 337 10*3/uL (ref 150–440)
RBC: 4.84 MIL/uL (ref 3.80–5.20)
RDW: 13.6 % (ref 11.5–14.5)
WBC: 8.3 10*3/uL (ref 3.6–11.0)

## 2016-01-15 LAB — BASIC METABOLIC PANEL
ANION GAP: 8 (ref 5–15)
BUN: 15 mg/dL (ref 6–20)
CALCIUM: 9.5 mg/dL (ref 8.9–10.3)
CO2: 25 mmol/L (ref 22–32)
CREATININE: 0.73 mg/dL (ref 0.44–1.00)
Chloride: 106 mmol/L (ref 101–111)
GFR calc non Af Amer: >60 mL/min (ref >60)
GLUCOSE: 77 mg/dL (ref 65–99)
Potassium: 3.4 mmol/L — ABNORMAL LOW (ref 3.5–5.1)
Sodium: 139 mmol/L (ref 135–145)

## 2016-01-15 LAB — TROPONIN I

## 2016-01-15 NOTE — ED Notes (Signed)
Walked by room, patient not in room and items removed and laying across bed.  EDP notified.

## 2016-01-15 NOTE — ED Notes (Signed)
Patient presents with pain to the LEFT side of her chest; lateral breast. (+) radiation of pain into LUE and LEFT neck. Patient denies SOB and diaphoresis.

## 2016-03-26 IMAGING — US US OB TRANSVAGINAL
1 series · 14 of 28 positions shown · non-contrast
Comparison: None.

CLINICAL DATA: Positive pregnancy test, pelvic pain for 4 hours

EXAM:
OBSTETRIC <14 WK US AND TRANSVAGINAL OB US
TECHNIQUE: Both transabdominal and transvaginal ultrasound examinations were
performed for complete evaluation of the gestation as well as the
maternal uterus, adnexal regions, and pelvic cul-de-sac.
Transvaginal technique was performed to assess early pregnancy.

[Series 1: us ob transvaginal · 0.20mm/px · 14 of 88 slices shown]
[im 4/88]
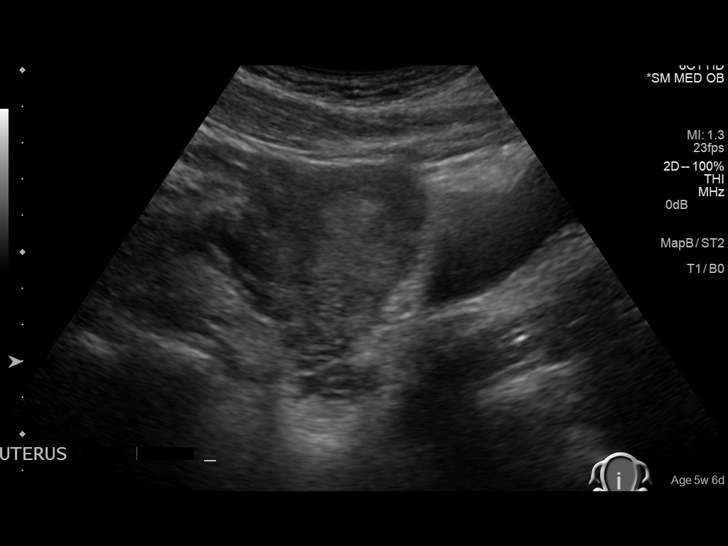
[im 10/88]
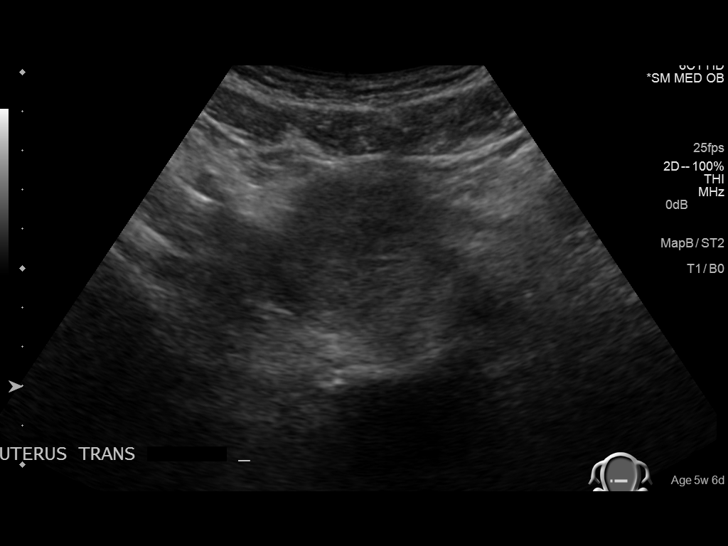
[im 17/88]
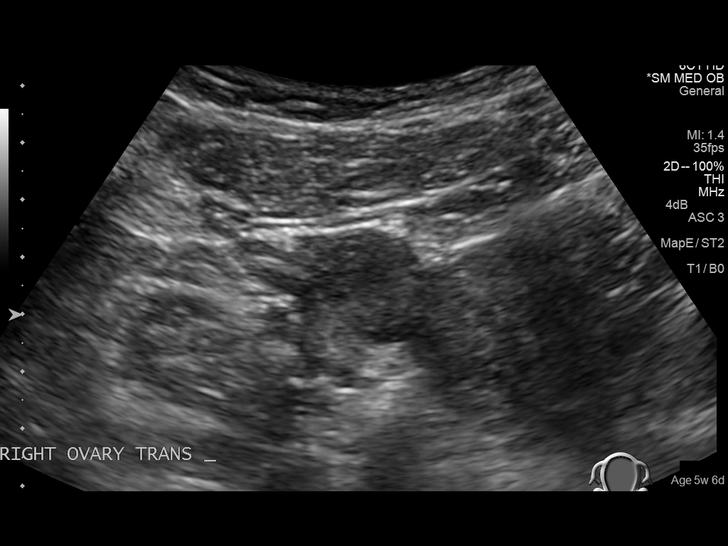
[im 23/88]
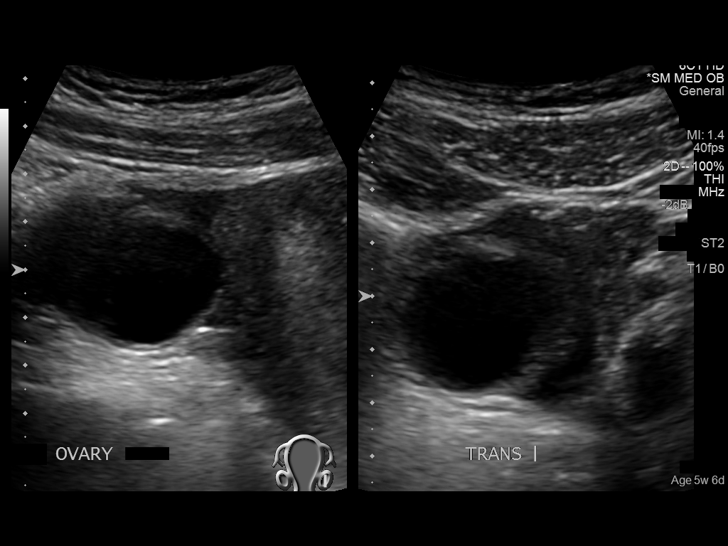
[im 30/88]
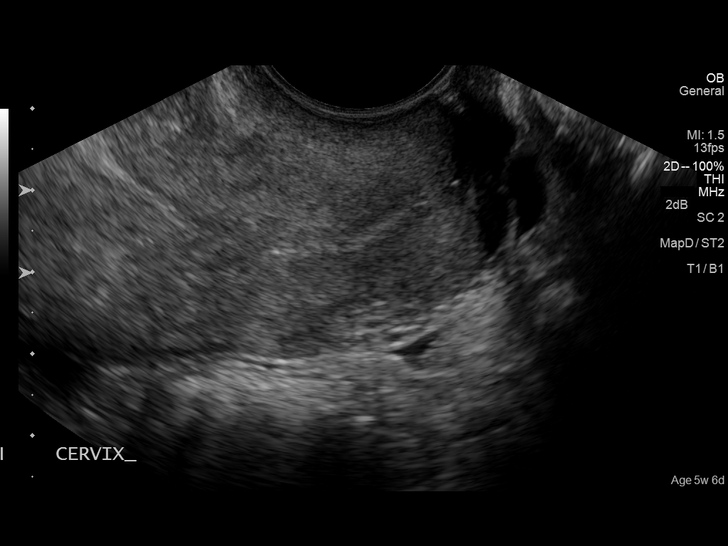
[im 36/88]
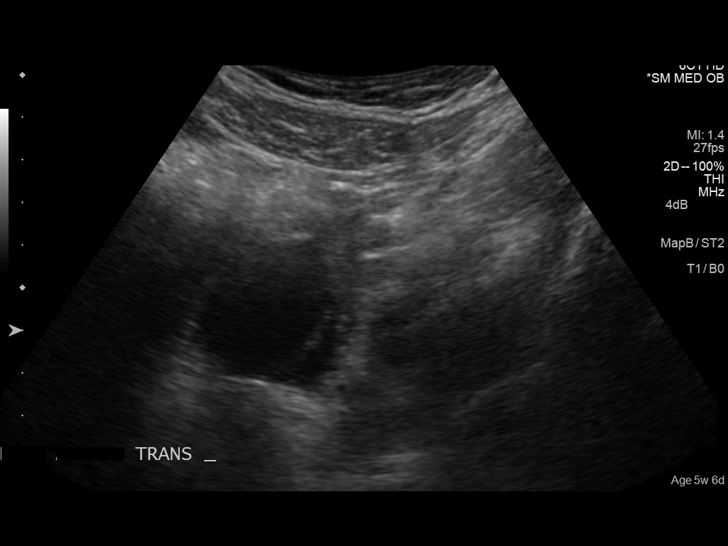
[im 42/88]
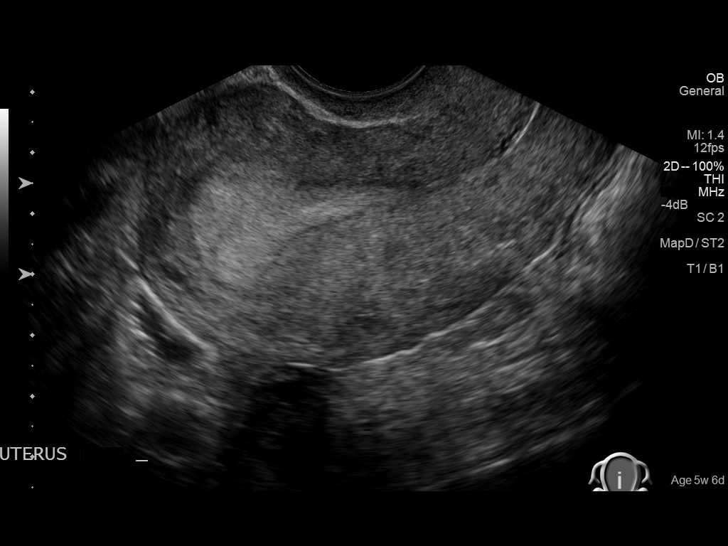
[im 49/88]
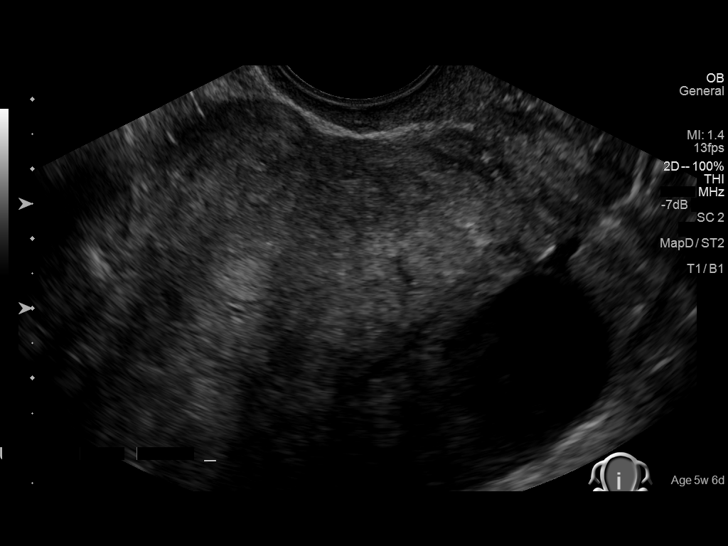
[im 55/88]
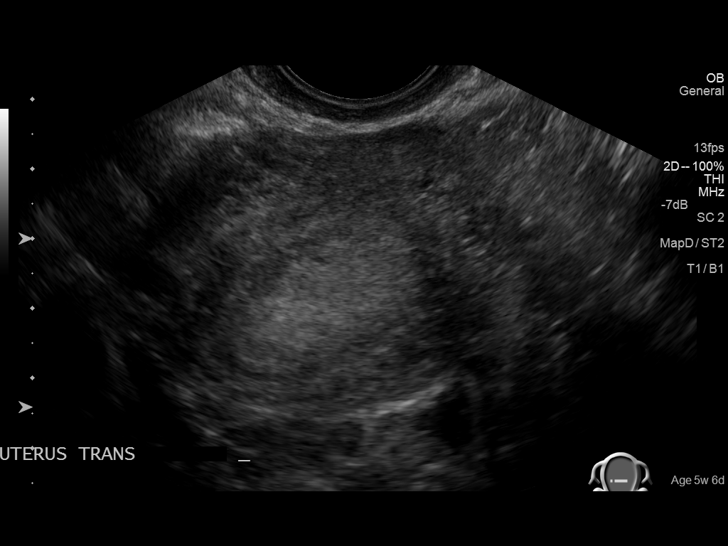
[im 62/88]
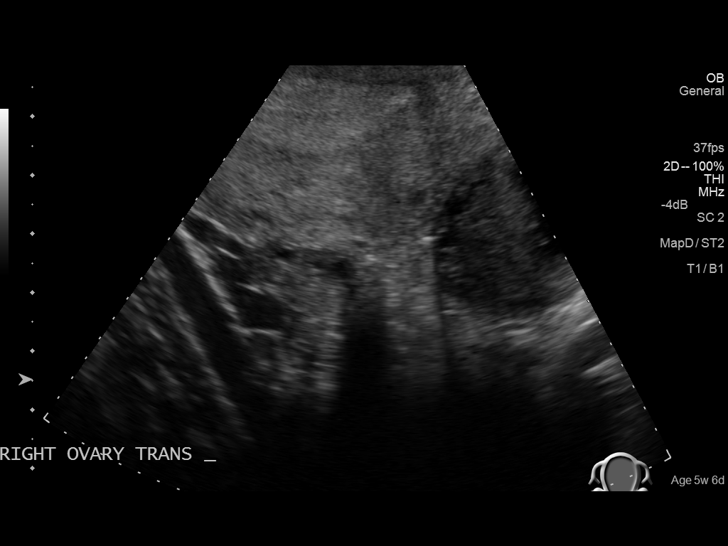
[im 68/88]
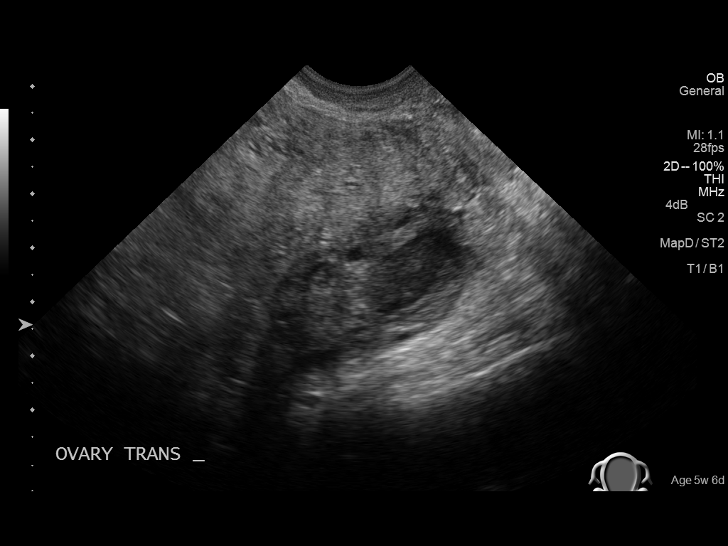
[im 75/88]
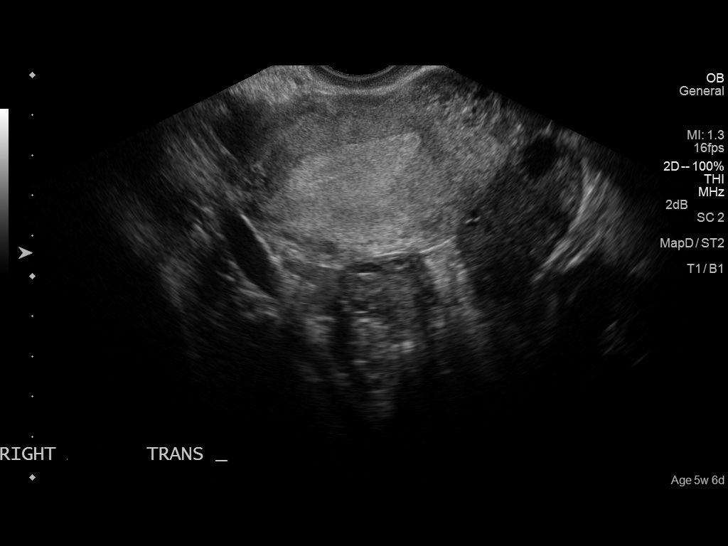
[im 81/88]
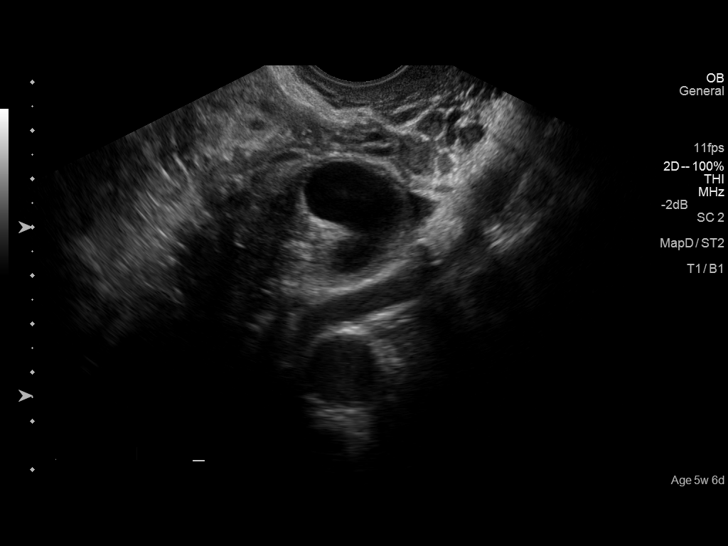
[im 88/88]
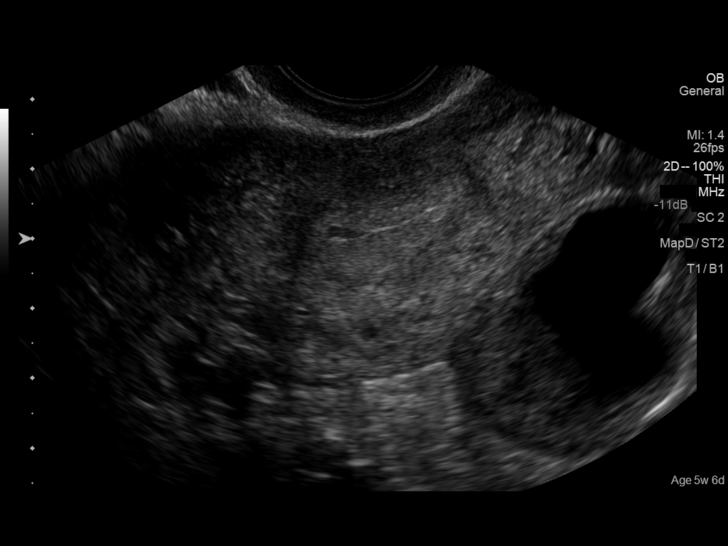

[14 of 28 positions shown; findings below may reference images not displayed]

FINDINGS: Intrauterine gestational sac: Not visualized

Yolk sac:  Not visualized

Embryo:  Not visualized

Cardiac Activity: Not visualized

Maternal uterus/adnexae: The ovaries are normal. Trace fluid in the
cul-de-sac.
IMPRESSION: No intrauterine gestational sac, yolk sac, or fetal pole identified.
Differential considerations include intrauterine pregnancy too early
to be sonographically visualized, missed abortion, or ectopic
pregnancy. Followup ultrasound is recommended in 10-14 days for
further evaluation.

## 2016-08-27 ENCOUNTER — Emergency Department
Admission: EM | Admit: 2016-08-27 | Discharge: 2016-08-27 | Disposition: A | Discharge status: Home / Self Care | Payer: Medicaid Other | Attending: Emergency Medicine | Admitting: Emergency Medicine

## 2016-08-27 ENCOUNTER — Encounter: Payer: Self-pay | Admitting: Emergency Medicine

## 2016-08-27 DIAGNOSIS — Z79899 Other long term (current) drug therapy: Secondary | ICD-10-CM | POA: Diagnosis not present

## 2016-08-27 DIAGNOSIS — F1010 Alcohol abuse, uncomplicated: Secondary | ICD-10-CM | POA: Diagnosis not present

## 2016-08-27 DIAGNOSIS — I10 Essential (primary) hypertension: Secondary | ICD-10-CM | POA: Diagnosis not present

## 2016-08-27 LAB — COMPREHENSIVE METABOLIC PANEL
ALK PHOS: 44 U/L (ref 38–126)
ALT: 11 U/L — AB (ref 14–54)
AST: 28 U/L (ref 15–41)
Albumin: 3.9 g/dL (ref 3.5–5.0)
Anion gap: 15 (ref 5–15)
BILIRUBIN TOTAL: 0.4 mg/dL (ref 0.3–1.2)
BUN: 14 mg/dL (ref 6–20)
CHLORIDE: 107 mmol/L (ref 101–111)
CO2: 18 mmol/L — ABNORMAL LOW (ref 22–32)
CREATININE: 0.8 mg/dL (ref 0.44–1.00)
Calcium: 8.2 mg/dL — ABNORMAL LOW (ref 8.9–10.3)
GFR calc Af Amer: >60 mL/min (ref >60)
Glucose, Bld: 79 mg/dL (ref 65–99)
Potassium: 2.7 mmol/L — CL (ref 3.5–5.1)
Sodium: 140 mmol/L (ref 135–145)
TOTAL PROTEIN: 7.7 g/dL (ref 6.5–8.1)

## 2016-08-27 LAB — CBC WITH DIFFERENTIAL/PLATELET
Basophils Absolute: 0.1 10*3/uL (ref 0–0.1)
Basophils Relative: 1 %
EOS ABS: 0.4 10*3/uL (ref 0–0.7)
EOS PCT: 5 %
HCT: 33.6 % — ABNORMAL LOW (ref 35.0–47.0)
Hemoglobin: 10.3 g/dL — ABNORMAL LOW (ref 12.0–16.0)
LYMPHS ABS: 3.4 10*3/uL (ref 1.0–3.6)
Lymphocytes Relative: 42 %
MCH: 24.8 pg — AB (ref 26.0–34.0)
MCHC: 30.7 g/dL — AB (ref 32.0–36.0)
MCV: 80.7 fL (ref 80.0–100.0)
Monocytes Absolute: 0.6 10*3/uL (ref 0.2–0.9)
Monocytes Relative: 7 %
Neutro Abs: 3.5 10*3/uL (ref 1.4–6.5)
Neutrophils Relative %: 45 %
PLATELETS: 341 10*3/uL (ref 150–440)
RBC: 4.17 MIL/uL (ref 3.80–5.20)
RDW: 16.7 % — ABNORMAL HIGH (ref 11.5–14.5)
WBC: 7.9 10*3/uL (ref 3.6–11.0)

## 2016-08-27 LAB — URINE DRUG SCREEN, QUALITATIVE (ARMC ONLY)
AMPHETAMINES, UR SCREEN: NOT DETECTED
BENZODIAZEPINE, UR SCRN: NOT DETECTED
Barbiturates, Ur Screen: NOT DETECTED
CANNABINOID 50 NG, UR ~~LOC~~: NOT DETECTED
Cocaine Metabolite,Ur ~~LOC~~: NOT DETECTED
MDMA (Ecstasy)Ur Screen: NOT DETECTED
Methadone Scn, Ur: NOT DETECTED
Opiate, Ur Screen: NOT DETECTED
PHENCYCLIDINE (PCP) UR S: NOT DETECTED
Tricyclic, Ur Screen: NOT DETECTED

## 2016-08-27 LAB — ETHANOL: ALCOHOL ETHYL (B): 257 mg/dL — AB (ref <5)

## 2016-08-27 LAB — POCT PREGNANCY, URINE: PREG TEST UR: NEGATIVE

## 2016-08-27 MED ORDER — SODIUM CHLORIDE 0.9 % IV BOLUS (SEPSIS)
1000.0000 mL | Freq: Once | INTRAVENOUS | Status: AC
  Administered 2016-08-27: 1000 mL via INTRAVENOUS

## 2016-08-27 MED ORDER — POTASSIUM CHLORIDE CRYS ER 20 MEQ PO TBCR
40.0000 meq | EXTENDED_RELEASE_TABLET | Freq: Once | ORAL | Status: AC
  Administered 2016-08-27: 40 meq via ORAL
  Filled 2016-08-27: qty 2

## 2016-08-27 MED ORDER — ONDANSETRON HCL 4 MG/2ML IJ SOLN
INTRAMUSCULAR | Status: AC
  Administered 2016-08-27: 4 mg via INTRAVENOUS
  Filled 2016-08-27: qty 2

## 2016-08-27 MED ORDER — ONDANSETRON HCL 4 MG/2ML IJ SOLN
4.0000 mg | Freq: Once | INTRAMUSCULAR | Status: AC
  Administered 2016-08-27: 4 mg via INTRAVENOUS

## 2016-08-27 NOTE — ED Notes (Signed)
ED Provider at bedside. 

## 2016-08-27 NOTE — Discharge Instructions (Signed)
Please return immediately if condition worsens. Please contact her primary physician or the physician you were given for referral. If you have any specialist physicians involved in her treatment and plan please also contact them. Thank you for using [facility]. ° °

## 2016-08-27 NOTE — ED Provider Notes (Signed)
Time Seen: Approximately *1621  I have reviewed the triage notes  Chief Complaint: Alcohol Intoxication   History of Present Illness: Christine Young is a 36 y.o. female who is brought to the emergency department via EMS. She was found intoxicated after drinking a large amount of alcohol over the last 2 days. Patient is very combative at the scene and was escorted by [employer]. No obvious evidence of head trauma. She received Haldol 5 mg IM prior to arrival.   Past Medical History:  Diagnosis Date  . Hypertension     There are no active problems to display for this patient.   History reviewed. No pertinent surgical history.  History reviewed. No pertinent surgical history.  Current Outpatient Rx  . Order #: [id number] Class: Print  . Order #: [id number] Class: Print    Allergies:  Penicillins  Family History: No family history on file.  Social History: Social History  Substance Use Topics  . Smoking status: Never Smoker  . Smokeless tobacco: Never Used  . Alcohol use Yes     Comment: occasionally     Review of Systems:   10 point review of systems was performed and was otherwise negative: Limited review of systems due to the patient's intoxication Constitutional: No fever Eyes: No visual disturbances ENT: No sore throat, ear pain Cardiac: No chest pain Respiratory: No shortness of breath, wheezing, or stridor Abdomen: No abdominal pain, no vomiting, No diarrhea Endocrine: No weight loss, No night sweats Extremities: No peripheral edema, cyanosis Skin: No rashes, easy bruising Neurologic: No focal weakness, trouble with speech or swollowing Urologic: No dysuria, Hematuria, or urinary frequency   Physical Exam:  ED Triage Vitals  Enc Vitals Group     BP      Pulse      Resp      Temp      Temp src      SpO2      Weight      Height      Head Circumference      Peak Flow      Pain Score      Pain Loc      Pain Edu?      Excl. in  GC?     General: Patient's intoxicated but does move all extremities spontaneously and is arousable and appears to be protecting her airway. Head: Normal cephalic , atraumatic Eyes: Pupils equal , round, reactive to light. Extraocular eye movements are intact conjunctiva is clear Nose/Throat: No nasal drainage, patent upper airway without erythema or exudate.  Neck: Supple, Full range of motion, No anterior adenopathy or palpable thyroid masses. Patient has bruising across the neck and an old scar left side Lungs: Clear to ascultation without wheezes , rhonchi, or rales Heart: Regular rate, regular rhythm without murmurs , gallops , or rubs Abdomen: Soft, non tender without rebound, guarding , or rigidity; bowel sounds positive and symmetric in all 4 quadrants. No organomegaly .        Extremities: 2 plus symmetric pulses. No edema, clubbing or cyanosis Neurologic: normal ambulation, Motor symmetric without deficits, sensory intact Skin: warm, dry, no rashes   Labs:   All laboratory work was reviewed including any pertinent negatives or positives listed below:  Labs Reviewed  COMPREHENSIVE METABOLIC PANEL  CBC WITH DIFFERENTIAL/PLATELET  ETHANOL  URINE DRUG SCREEN, QUALITATIVE ([facility] ONLY)  POC URINE PREG, ED  Patient's blood alcohol level was 257 and her potassium is 2.7  EKG:  ED ECG REPORT I, Jennye MoccasinBrian S Quigley, the attending physician, personally viewed and interpreted this ECG.  Date: 08/27/2016 EKG Time: 1607 Rate: 87 Rhythm: normal sinus rhythm QRS Axis: normal Intervals: normal ST/T Wave abnormalities: normal Conduction Disturbances: none Narrative Interpretation: unremarkable Normal EKG    ED Course:  Patient was up and ambulatory here after observation. She was given information on substance abuse and local treatment programs. She denies being suicidal, homicidal, or having any hallucinations. She was able to ambulate answer questions and knew the date and current  location. She had no other further physical complaints Assessment: * Acute alcohol intoxication      Plan:  Outpatient Patient was advised to return immediately if condition worsens. Patient was advised to follow up with their primary care physician or other specialized physicians involved in their outpatient care. The patient and/or family member/power of attorney had laboratory results reviewed at the bedside. All questions and concerns were addressed and appropriate discharge instructions were distributed by the nursing staff.             Jennye MoccasinBrian S Quigley, MD 08/27/16 [phone]

## 2016-08-27 NOTE — ED Triage Notes (Signed)
Pt comes into the ED via EMS from a residence where she was found intoxicated.  Patient found to have drank a 1/2 gallon of alcohol yesterday and a 5th of alcohol today.  Patient was combative on scene and has been escorted with [employer].  Patient given 5 haldol with EMS.  Patient presents with bruising noted to her neck.  Patient has even and unlabored respirations at this time.

## 2017-02-04 ENCOUNTER — Encounter: Payer: Self-pay | Admitting: Emergency Medicine

## 2017-02-04 ENCOUNTER — Emergency Department
Admission: EM | Admit: 2017-02-04 | Discharge: 2017-02-04 | Disposition: A | Discharge status: Home / Self Care | Payer: Medicaid Other | Attending: Emergency Medicine | Admitting: Emergency Medicine

## 2017-02-04 DIAGNOSIS — R42 Dizziness and giddiness: Secondary | ICD-10-CM | POA: Insufficient documentation

## 2017-02-04 DIAGNOSIS — Z79899 Other long term (current) drug therapy: Secondary | ICD-10-CM | POA: Diagnosis not present

## 2017-02-04 DIAGNOSIS — I10 Essential (primary) hypertension: Secondary | ICD-10-CM | POA: Diagnosis present

## 2017-02-04 LAB — BASIC METABOLIC PANEL
ANION GAP: 10 (ref 5–15)
BUN: 11 mg/dL (ref 6–20)
CALCIUM: 9.3 mg/dL (ref 8.9–10.3)
CO2: 24 mmol/L (ref 22–32)
CREATININE: 0.78 mg/dL (ref 0.44–1.00)
Chloride: 106 mmol/L (ref 101–111)
GFR calc Af Amer: >60 mL/min (ref >60)
GLUCOSE: 92 mg/dL (ref 65–99)
Potassium: 3.6 mmol/L (ref 3.5–5.1)
Sodium: 140 mmol/L (ref 135–145)

## 2017-02-04 LAB — URINALYSIS, COMPLETE (UACMP) WITH MICROSCOPIC
Bacteria, UA: NONE SEEN
Bilirubin Urine: NEGATIVE
GLUCOSE, UA: NEGATIVE mg/dL
Ketones, ur: NEGATIVE mg/dL
Leukocytes, UA: NEGATIVE
NITRITE: NEGATIVE
Protein, ur: NEGATIVE mg/dL
Specific Gravity, Urine: 1.018 (ref 1.005–1.030)
pH: 5 (ref 5.0–8.0)

## 2017-02-04 LAB — CBC
HCT: 39.6 % (ref 35.0–47.0)
Hemoglobin: 13.3 g/dL (ref 12.0–16.0)
MCH: 28.8 pg (ref 26.0–34.0)
MCHC: 33.5 g/dL (ref 32.0–36.0)
MCV: 85.9 fL (ref 80.0–100.0)
PLATELETS: 321 10*3/uL (ref 150–440)
RBC: 4.61 MIL/uL (ref 3.80–5.20)
RDW: 15.7 % — ABNORMAL HIGH (ref 11.5–14.5)
WBC: 5.3 10*3/uL (ref 3.6–11.0)

## 2017-02-04 LAB — PREGNANCY, URINE: Preg Test, Ur: NEGATIVE

## 2017-02-04 LAB — TROPONIN I: Troponin I: <0.03 ng/mL (ref <0.03)

## 2017-02-04 MED ORDER — AMLODIPINE BESYLATE 5 MG PO TABS: 5.0000 mg | ORAL_TABLET | Freq: Every day | ORAL | 1 refills | Status: DC

## 2017-02-04 NOTE — ED Triage Notes (Signed)
STates was feeling dizzy at work today and checked blood pressure, which was elevated.  [facility] [employer] [facility], who sent patient to ED.  Patient states she has history of HTN and has been prescribed medication for BP, but has not taken any medication for over one year.

## 2017-02-04 NOTE — Discharge Instructions (Signed)
Please seek medical attention for any high fevers, chest pain, shortness of breath, change in behavior, persistent vomiting, bloody stool or any other new or concerning symptoms.  

## 2017-02-04 NOTE — ED Provider Notes (Signed)
[facility] Emergency Department Provider Note   ____________________________________________   I have reviewed the triage vital signs and the nursing notes.   HISTORY  Chief Complaint Hypertension   History limited by: Not Limited   HPI Christine Young is a 36 y.o. female who presents to the emergency department today because of concerns for high blood pressure and dizziness per patient states she has a history of blood pressure issues. She had been prescribed medication one point but states she has not been taking it. This was a couple of years ago. She states normally her systolic blood pressures in the 140s. Today she felt dizzy at work. She checked her blood pressure with systolic in the 160s. She denies any dizziness, examination. She states she does get dizzy from time to time. She denied any chest pain.    Past Medical History:  Diagnosis Date  . Hypertension     There are no active problems to display for this patient.   History reviewed. No pertinent surgical history.  Prior to Admission medications   Medication Sig Start Date End Date Taking? Authorizing Provider  famotidine (PEPCID) 20 MG tablet Take 1 tablet (20 mg total) by mouth daily. 01/24/15 01/24/16  Emily FilbertWilliams, Jonathan E, MD  ondansetron (ZOFRAN ODT) 4 MG disintegrating tablet Take 1 tablet (4 mg total) by mouth every 8 (eight) hours as needed for nausea or vomiting. 01/24/15   Emily FilbertWilliams, Jonathan E, MD    Allergies Penicillins  No family history on file.  Social History Social History  Substance Use Topics  . Smoking status: Never Smoker  . Smokeless tobacco: Never Used  . Alcohol use Yes     Comment: occasionally    Review of Systems Constitutional: No fever/chills. Positive for lightheadedness.  Eyes: No visual changes. ENT: No sore throat. Cardiovascular: Denies chest pain. Respiratory: Denies shortness of breath. Gastrointestinal: No abdominal pain.  No nausea, no  vomiting.  No diarrhea.   Genitourinary: Negative for dysuria. Musculoskeletal: Negative for back pain. Skin: Negative for rash. Neurological: Negative for headaches, focal weakness or numbness.  ____________________________________________   PHYSICAL EXAM:  VITAL SIGNS: ED Triage Vitals  Enc Vitals Group     BP 02/04/17 1135 (!) 152/95     Pulse Rate 02/04/17 1135 93     Resp 02/04/17 1135 16     Temp 02/04/17 1135 98.7 F (37.1 C)     Temp Source 02/04/17 1135 Oral     SpO2 02/04/17 1135 100 %     Weight 02/04/17 1133 185 lb (83.9 kg)     Height 02/04/17 1133 5\' 7"  (1.702 m)   Constitutional: Alert and oriented. Well appearing and in no distress. Eyes: Conjunctivae are normal.  ENT   Head: Normocephalic and atraumatic.   Nose: No congestion/rhinnorhea.   Mouth/Throat: Mucous membranes are moist.   Neck: No stridor. Hematological/Lymphatic/Immunilogical: No cervical lymphadenopathy. Cardiovascular: Normal rate, regular rhythm.  No murmurs, rubs, or gallops. Respiratory: Normal respiratory effort without tachypnea nor retractions. Breath sounds are clear and equal bilaterally. No wheezes/rales/rhonchi. Gastrointestinal: Soft and non tender. No rebound. No guarding.  Genitourinary: Deferred Musculoskeletal: Normal range of motion in all extremities. No lower extremity edema. Neurologic:  Normal speech and language. No gross focal neurologic deficits are appreciated.  Skin:  Skin is warm, dry and intact. No rash noted. Psychiatric: Mood and affect are normal. Speech and behavior are normal. Patient exhibits appropriate insight and judgment.  ____________________________________________    LABS (pertinent positives/negatives)  Labs  Reviewed  CBC - Abnormal; Notable for the following:       Result Value   RDW 15.7 (*)    All other components within normal limits  URINALYSIS, COMPLETE (UACMP) WITH MICROSCOPIC - Abnormal; Notable for the following:    Color,  Urine YELLOW (*)    APPearance HAZY (*)    Hgb urine dipstick LARGE (*)    Squamous Epithelial / LPF 6-30 (*)    All other components within normal limits  BASIC METABOLIC PANEL  TROPONIN I  PREGNANCY, URINE  CBG MONITORING, ED     ____________________________________________   EKG  I, Phineas SemenGraydon Avaree Gilberti, attending physician, personally viewed and interpreted this EKG  EKG Time: 1139 Rate: 79 Rhythm: sinus rhythm Axis: normal Intervals: qtc 438 QRS: narrow ST changes: no st elevation Impression: normal ekg   ____________________________________________    RADIOLOGY  None  ____________________________________________   PROCEDURES  Procedures  ____________________________________________   INITIAL IMPRESSION / ASSESSMENT AND PLAN / ED COURSE  Pertinent labs & imaging results that were available during my care of the patient were reviewed by me and considered in my medical decision making (see chart for details).  Patient presents to the emergency department today with high blood pressure. Patient's blood work and EKG without concerning findings. Patient asymptomatic, examination. Will start patient on low-dose amlodipine for blood pressure control. Discussed with patient importance of follow-up with primary care.  ____________________________________________   FINAL CLINICAL IMPRESSION(S) / ED DIAGNOSES  Final diagnoses:  Hypertension, unspecified type     Note: This dictation was prepared with Dragon dictation. Any transcriptional errors that result from this process are unintentional     Phineas SemenGoodman, Odysseus Cada, MD 02/04/17 1439

## 2017-10-01 ENCOUNTER — Encounter (HOSPITAL_COMMUNITY): Payer: Self-pay | Admitting: Emergency Medicine

## 2017-10-01 ENCOUNTER — Emergency Department (HOSPITAL_COMMUNITY)
Admission: EM | Admit: 2017-10-01 | Discharge: 2017-10-01 | Disposition: A | Discharge status: Home / Self Care | Payer: Medicaid Other | Attending: Emergency Medicine | Admitting: Emergency Medicine

## 2017-10-01 DIAGNOSIS — J020 Streptococcal pharyngitis: Secondary | ICD-10-CM | POA: Diagnosis not present

## 2017-10-01 DIAGNOSIS — I10 Essential (primary) hypertension: Secondary | ICD-10-CM | POA: Insufficient documentation

## 2017-10-01 DIAGNOSIS — Z79899 Other long term (current) drug therapy: Secondary | ICD-10-CM | POA: Diagnosis not present

## 2017-10-01 DIAGNOSIS — J029 Acute pharyngitis, unspecified: Secondary | ICD-10-CM | POA: Diagnosis present

## 2017-10-01 LAB — RAPID STREP SCREEN (MED CTR MEBANE ONLY): Streptococcus, Group A Screen (Direct): POSITIVE — AB

## 2017-10-01 MED ORDER — AZITHROMYCIN 250 MG PO TABS: 250.0000 mg | ORAL_TABLET | Freq: Every day | ORAL | 0 refills | Status: DC

## 2017-10-01 MED ORDER — GI COCKTAIL ~~LOC~~
30.0000 mL | Freq: Once | ORAL | Status: AC
  Administered 2017-10-01: 30 mL via ORAL
  Filled 2017-10-01: qty 30

## 2017-10-01 MED ORDER — AZITHROMYCIN 250 MG PO TABS
500.0000 mg | ORAL_TABLET | Freq: Once | ORAL | Status: AC
Start: 2017-10-01 — End: 2017-10-01
  Administered 2017-10-01: 500 mg via ORAL
  Filled 2017-10-01: qty 2

## 2017-10-01 MED ORDER — IBUPROFEN 600 MG PO TABS: 600.0000 mg | ORAL_TABLET | Freq: Four times a day (QID) | ORAL | 0 refills | Status: DC | PRN

## 2017-10-01 MED ORDER — DEXAMETHASONE 4 MG PO TABS
10.0000 mg | ORAL_TABLET | Freq: Once | ORAL | Status: AC
  Administered 2017-10-01: 10 mg via ORAL
  Filled 2017-10-01: qty 3

## 2017-10-01 MED ORDER — IBUPROFEN 800 MG PO TABS
800.0000 mg | ORAL_TABLET | Freq: Once | ORAL | Status: AC
  Administered 2017-10-01: 800 mg via ORAL
  Filled 2017-10-01: qty 1

## 2017-10-01 MED ORDER — PHENOL 1.4 % MT LIQD: 1.0000 | OROMUCOSAL | 0 refills | Status: DC | PRN

## 2017-10-01 NOTE — ED Triage Notes (Signed)
Pt reports nasal congestion, aches, sore throat X1 day.

## 2017-10-01 NOTE — Discharge Instructions (Signed)
Take azithromycin as prescribed until finished.  You were given your first dose in the emergency department prior to discharge.  Continue with ibuprofen every 6 hours for pain control.  You may supplement this with Chloraseptic spray as needed.  Follow-up with a primary care doctor to ensure resolution of symptoms.  You may return to the emergency department if symptoms persist or worsen.

## 2017-10-01 NOTE — ED Provider Notes (Signed)
[facility]ENC[facility] Provider Note   CSN: [id number] Arrival date & time: 10/01/17  0248     History   Chief Complaint Chief Complaint  Patient presents with  . Nasal Congestion    HPI Christine Young is a 37 y.o. female.   37 year old female presents to the emergency department for evaluation of sore throat.  She has had symptoms over the past 3 days which have been gradually worsening.  She notes associated nasal congestion and body aches.  She states that her neck and back have been sore.  She has been taking Tylenol for symptoms without relief.  Sore throat is aggravated with swallowing.  No inability to swallow or drooling.  She denies any known fevers or sick contacts, vomiting, diarrhea.     Past Medical History:  Diagnosis Date  . Hypertension     There are no active problems to display for this patient.   History reviewed. No pertinent surgical history.  OB History    Gravida  7   Para  6   Term      Preterm      AB      Living        SAB      TAB      Ectopic      Multiple      Live Births               Home Medications    Prior to Admission medications   Medication Sig Start Date End Date Taking? Authorizing Provider  amLODipine (NORVASC) 5 MG tablet Take 1 tablet (5 mg total) by mouth daily. 02/04/17 02/04/18  Phineas SemenGoodman, Graydon, MD  azithromycin (ZITHROMAX) 250 MG tablet Take 1 tablet (250 mg total) by mouth daily. Take 1 every day until finished beginning on the morning of 10/02/17. 10/01/17   Antony MaduraHumes, Lorn Butcher, PA-C  famotidine (PEPCID) 20 MG tablet Take 1 tablet (20 mg total) by mouth daily. 01/24/15 01/24/16  Emily FilbertWilliams, Jonathan E, MD  ibuprofen (ADVIL,MOTRIN) 600 MG tablet Take 1 tablet (600 mg total) by mouth every 6 (six) hours as needed. 10/01/17   Antony MaduraHumes, Jermone Geister, PA-C  ondansetron (ZOFRAN ODT) 4 MG disintegrating tablet Take 1 tablet (4 mg total) by mouth every 8 (eight) hours as needed for nausea or vomiting.  01/24/15   Emily FilbertWilliams, Jonathan E, MD  phenol (CHLORASEPTIC) 1.4 % LIQD Use as directed 1 spray in the mouth or throat as needed for throat irritation / pain. 10/01/17   Antony MaduraHumes, Zienna Ahlin, PA-C    Family History No family history on file.  Social History Social History   Tobacco Use  . Smoking status: Never Smoker  . Smokeless tobacco: Never Used  Substance Use Topics  . Alcohol use: Yes    Comment: occasionally  . Drug use: Not on file     Allergies   Penicillins   Review of Systems Review of Systems Ten systems reviewed and are negative for acute change, except as noted in the HPI.    Physical Exam Updated Vital Signs BP (!) 140/92 (BP Location: Right Arm)   Pulse 98   Temp 98.7 F (37.1 C) (Oral)   Resp 18   Ht 5\' 6"  (1.676 m)   Wt 83.9 kg (185 lb)   SpO2 100%   BMI 29.86 kg/m   Physical Exam  Constitutional: She is oriented to person, place, and time. She appears well-developed and well-nourished. No distress.  Nontoxic appearing and in NAD  HENT:  Head: Normocephalic and atraumatic.  Tonsillar enlargement bilaterally with midline uvula.  Scant exudates noted to the posterior oropharynx.  Patient tolerating secretions without difficulty.  No tripoding or stridor.  No voice muffling.  Eyes: Conjunctivae and EOM are normal. No scleral icterus.  Neck: Normal range of motion.  No meningismus  Cardiovascular: Normal rate, regular rhythm and intact distal pulses.  Pulmonary/Chest: Effort normal. No stridor. No respiratory distress.  Respirations even and unlabored  Musculoskeletal: Normal range of motion.  Lymphadenopathy:    She has cervical adenopathy (Tender, tonsillar).  Neurological: She is alert and oriented to person, place, and time. She exhibits normal muscle tone. Coordination normal.  Skin: Skin is warm and dry. No rash noted. She is not diaphoretic. No erythema. No pallor.  Psychiatric: She has a normal mood and affect. Her behavior is normal.  Nursing  note and vitals reviewed.    ED Treatments / Results  Labs (all labs ordered are listed, but only abnormal results are displayed) Labs Reviewed  RAPID STREP SCREEN (NOT AT [facility]) - Abnormal; Notable for the following components:      Result Value   Streptococcus, Group A Screen (Direct) POSITIVE (*)    All other components within normal limits    EKG  EKG Interpretation None       Radiology No results found.  Procedures Procedures (including critical care time)  Medications Ordered in ED Medications  dexamethasone (DECADRON) tablet 10 mg (has no administration in time range)  azithromycin (ZITHROMAX) tablet 500 mg (has no administration in time range)  ibuprofen (ADVIL,MOTRIN) tablet 800 mg (800 mg Oral Given 10/01/17 0546)  gi cocktail (Maalox,Lidocaine,Donnatal) (30 mLs Oral Given 10/01/17 0546)     Initial Impression / Assessment and Plan / ED Course  I have reviewed the triage vital signs and the nursing notes.  Pertinent labs & imaging results that were available during my care of the patient were reviewed by me and considered in my medical decision making (see chart for details).     Patient with tonsillar exudate, cervical lymphadenopathy, and dysphagia; diagnosis of strep. Treated in the ED with steroids, NSAIDs and first dose of Azithromycin. Presentation not concerning for PTA or infxn spread to soft tissue. No trismus or uvula deviation.  Patient tolerating PO tablets, fluids, and secretions.  No tripoding or voice muffling.  Return precautions discussed and provided. Patient discharged in stable condition with no unaddressed concerns.   Final Clinical Impressions(s) / ED Diagnoses   Final diagnoses:  Strep throat    ED Discharge Orders        Ordered    azithromycin (ZITHROMAX) 250 MG tablet  Daily     10/01/17 0557    ibuprofen (ADVIL,MOTRIN) 600 MG tablet  Every 6 hours PRN     10/01/17 0557    phenol (CHLORASEPTIC) 1.4 % LIQD  As needed      10/01/17 0557       Antony Madura, PA-C 10/01/17 0601    Ward, Layla Maw, DO 10/01/17 [phone]

## 2017-10-01 NOTE — ED Notes (Signed)
Ill for 3 days neck and back pain with a sore throat

## 2017-10-01 NOTE — ED Notes (Signed)
Pt verbalizes understanding of d/c instructions. Pt received prescriptions. Pt ambulatory at d/c with all belongings.  

## 2017-10-25 ENCOUNTER — Other Ambulatory Visit: Payer: Self-pay

## 2017-10-25 ENCOUNTER — Ambulatory Visit (HOSPITAL_COMMUNITY)
Admission: EM | Admit: 2017-10-25 | Discharge: 2017-10-25 | Disposition: A | Discharge status: Home / Self Care | Payer: Medicaid Other | Attending: Urgent Care | Admitting: Urgent Care

## 2017-10-25 DIAGNOSIS — I10 Essential (primary) hypertension: Secondary | ICD-10-CM

## 2017-10-25 MED ORDER — AMLODIPINE BESYLATE 5 MG PO TABS: 5.0000 mg | ORAL_TABLET | Freq: Every day | ORAL | 0 refills | Status: DC

## 2017-10-25 NOTE — ED Provider Notes (Signed)
  MRN: [id number] DOB: 03/17/1981  Subjective:   Christine Young is a 37 y.o. female presenting for refill of her amlodipine.  [place] [place] [place] and has done establish care, she ran out of her medication 2 days ago.  She would like help to find a PCP.  She takes no other medications besides her blood pressure medicine.  Takes amlodipine.    Allergies  Allergen Reactions  . Penicillins     Past Medical History:  Diagnosis Date  . Hypertension      Denies past surgical history.  Objective:   Vitals: BP 130/86 (BP Location: Left Arm)   Pulse 68   Temp 98 F (36.7 C) (Oral)   Resp 16   SpO2 100%   Physical Exam  Constitutional: She is oriented to person, place, and time. She appears well-developed and well-nourished.  Cardiovascular: Normal rate.  Pulmonary/Chest: Effort normal.  Neurological: She is alert and oriented to person, place, and time.  Psychiatric: She has a normal mood and affect.   Assessment and Plan :   Essential hypertension  Courtesy refill provided to patient to bridge appointment for her to establish primary care.   Wallis BambergMani, Kacee Koren, [place]C 10/25/17 [phone]

## 2017-10-28 ENCOUNTER — Ambulatory Visit (HOSPITAL_COMMUNITY)
Admission: EM | Admit: 2017-10-28 | Discharge: 2017-10-28 | Disposition: A | Discharge status: Home / Self Care | Payer: Medicaid Other | Attending: Urgent Care | Admitting: Urgent Care

## 2017-10-28 ENCOUNTER — Encounter (HOSPITAL_COMMUNITY): Payer: Self-pay | Admitting: Family Medicine

## 2017-10-28 DIAGNOSIS — K047 Periapical abscess without sinus: Secondary | ICD-10-CM | POA: Diagnosis not present

## 2017-10-28 DIAGNOSIS — K0889 Other specified disorders of teeth and supporting structures: Secondary | ICD-10-CM

## 2017-10-28 MED ORDER — CLINDAMYCIN HCL 300 MG PO CAPS: 300.0000 mg | ORAL_CAPSULE | Freq: Three times a day (TID) | ORAL | 0 refills | Status: DC

## 2017-10-28 MED ORDER — CLINDAMYCIN HCL 150 MG PO CAPS: 150.0000 mg | ORAL_CAPSULE | Freq: Three times a day (TID) | ORAL | 0 refills | Status: DC

## 2017-10-28 NOTE — ED Provider Notes (Signed)
  MRN: [id number] DOB: 08/17/1980  Subjective:   Christine Young is a 37 y.o. female presenting for several day history of worsening right lower tooth pain, headache.  Has also had facial pain, swelling.  Admits that she intermittently has small blisterlike lesions around her tooth on right lower side, patient just pops them.  Patient has tried Motrin and Tylenol without any relief.  She denies fever, nausea, vomiting.  Not have a dentist currently.  No current facility-administered medications for this encounter.   Current Outpatient Medications:  .  amLODipine (NORVASC) 5 MG tablet, Take 1 tablet (5 mg total) by mouth daily., Disp: 90 tablet, Rfl: 0   Allergies  Allergen Reactions  . Penicillins     Past Medical History:  Diagnosis Date  . Hypertension      History reviewed. No pertinent surgical history.  Objective:   Vitals: BP 140/88   Pulse 80   Temp 98.6 F (37 C)   Resp 18   LMP 10/28/2017   SpO2 100%   Physical Exam  Constitutional: She is oriented to person, place, and time. She appears well-developed and well-nourished.  HENT:  Mouth/Throat:    Cardiovascular: Normal rate.  Pulmonary/Chest: Effort normal.  Neurological: She is alert and oriented to person, place, and time.   Assessment and Plan :   Dental infection  Pain, dental  Patient has allergies to amoxicillin, per up-to-date recommendation is to start patient on 450 mg of clindamycin 3 times daily.  Emphasized to patient importance of a dental consult.  Patient states that she will try to get in with a dentist immediately.     Wallis BambergMani, Abrahim Sargent, [place]-C 10/29/17 [phone]

## 2017-10-28 NOTE — ED Triage Notes (Signed)
Pt here for dental pain and headache. She tried motrin and tylenol without relief.

## 2017-10-29 ENCOUNTER — Encounter (HOSPITAL_COMMUNITY): Payer: Self-pay | Admitting: Urgent Care

## 2017-11-06 ENCOUNTER — Ambulatory Visit (HOSPITAL_COMMUNITY)
Admission: EM | Admit: 2017-11-06 | Discharge: 2017-11-06 | Disposition: A | Discharge status: Home / Self Care | Payer: Medicaid Other | Attending: Family Medicine | Admitting: Family Medicine

## 2017-11-06 ENCOUNTER — Encounter (HOSPITAL_COMMUNITY): Payer: Self-pay | Admitting: Emergency Medicine

## 2017-11-06 DIAGNOSIS — R350 Frequency of micturition: Secondary | ICD-10-CM | POA: Insufficient documentation

## 2017-11-06 DIAGNOSIS — R1013 Epigastric pain: Secondary | ICD-10-CM | POA: Diagnosis not present

## 2017-11-06 DIAGNOSIS — I10 Essential (primary) hypertension: Secondary | ICD-10-CM | POA: Diagnosis not present

## 2017-11-06 DIAGNOSIS — Z880 Allergy status to penicillin: Secondary | ICD-10-CM | POA: Diagnosis not present

## 2017-11-06 DIAGNOSIS — R109 Unspecified abdominal pain: Secondary | ICD-10-CM | POA: Diagnosis present

## 2017-11-06 LAB — POCT URINALYSIS DIP (DEVICE)
Bilirubin Urine: NEGATIVE
GLUCOSE, UA: NEGATIVE mg/dL
KETONES UR: NEGATIVE mg/dL
LEUKOCYTES UA: NEGATIVE
Nitrite: NEGATIVE
Protein, ur: NEGATIVE mg/dL
SPECIFIC GRAVITY, URINE: 1.02 (ref 1.005–1.030)
Urobilinogen, UA: 0.2 mg/dL (ref 0.0–1.0)
pH: 7 (ref 5.0–8.0)

## 2017-11-06 LAB — POCT PREGNANCY, URINE: Preg Test, Ur: NEGATIVE

## 2017-11-06 MED ORDER — GI COCKTAIL ~~LOC~~
30.0000 mL | Freq: Once | ORAL | Status: AC
  Administered 2017-11-06: 30 mL via ORAL

## 2017-11-06 MED ORDER — GI COCKTAIL ~~LOC~~
ORAL | Status: AC
  Filled 2017-11-06: qty 30

## 2017-11-06 MED ORDER — AMLODIPINE BESYLATE 10 MG PO TABS: 10.0000 mg | ORAL_TABLET | Freq: Every day | ORAL | 0 refills | Status: AC

## 2017-11-06 MED ORDER — OMEPRAZOLE 20 MG PO CPDR: 20.0000 mg | DELAYED_RELEASE_CAPSULE | Freq: Every day | ORAL | 0 refills | Status: DC

## 2017-11-06 NOTE — ED Triage Notes (Signed)
Pt c/o upper abdominal pain this morning, states she had her cycle and noticed blood on the tissue when she wiped. Pt states she had her tubes tied.

## 2017-11-06 NOTE — Discharge Instructions (Signed)
Please start daily omeprazole. Please use tylenol rather than ibuprofen.  Please increase your blood pressure medication to 10mg  daily.  Please follow up here for BP recheck in 1-2 weeks.  Please establish with a primary care provider for BP management. I have sent your urine to be checked for vaginal yeast, bacteria or stds which may be contributing to your vaginal spotting. If you develop worsening of pain, bleeding, cramping, dizziness, headache or otherwise worsening please return or go to Er.

## 2017-11-06 NOTE — ED Provider Notes (Signed)
[facility]    CSN: [id number] Arrival date & time: 11/06/17  1957     History   Chief Complaint Chief Complaint  Patient presents with  . Abdominal Pain    HPI Christine Young is a 37 y.o. female.   Christine Young presents with complaints of epigastric pain which started this morning as well as increased blood pressure which makes her feel intermittently light headed. Has had elevated BP for the past 3 days, has been checking it at work. She feels epigastric pain mildly improved with ibuprofen today, rates it still 8/10. Mild nausea. States she does experience heartburn symptoms and does take tums for this. Also complaints of some vaginal spotting which has been ongoing since this morning. No pelvic pain. LMP 4/17 so not typical for her to have spotting. Has had tubal ligation. 1 sexual partner, does not use condoms. Denies any other vaginal symptoms. States has had some mild urinary frequency. Spotting is noted only with wiping, has not had to use a pad or tampon. Does not have a PCP as moved here in the past few months.    ROS per HPI.      Past Medical History:  Diagnosis Date  . Hypertension     There are no active problems to display for this patient.   History reviewed. No pertinent surgical history.  OB History    Gravida  7   Para  6   Term      Preterm      AB      Living        SAB      TAB      Ectopic      Multiple      Live Births               Home Medications    Prior to Admission medications   Medication Sig Start Date End Date Taking? Authorizing Provider  amLODipine (NORVASC) 10 MG tablet Take 1 tablet (10 mg total) by mouth daily. 11/06/17   Georgetta HaberBurky, Natalie B, NP  clindamycin (CLEOCIN) 150 MG capsule Take 1 capsule (150 mg total) by mouth 3 (three) times daily. 10/28/17   Wallis BambergMani, Mario, PA-C  clindamycin (CLEOCIN) 300 MG capsule Take 1 capsule (300 mg total) by mouth 3 (three) times daily. 10/28/17   Wallis BambergMani, Mario, PA-C  omeprazole  (PRILOSEC) 20 MG capsule Take 1 capsule (20 mg total) by mouth daily. 11/06/17   Georgetta HaberBurky, Natalie B, NP    Family History No family history on file.  Social History Social History   Tobacco Use  . Smoking status: Never Smoker  . Smokeless tobacco: Never Used  Substance Use Topics  . Alcohol use: Yes    Comment: occasionally  . Drug use: Not on file     Allergies   Penicillins   Review of Systems Review of Systems   Physical Exam Triage Vital Signs ED Triage Vitals [11/06/17 2002]  Enc Vitals Group     BP (!) 157/111     Pulse Rate 99     Resp 18     Temp 98.7 F (37.1 C)     Temp src      SpO2 100 %     Weight      Height      Head Circumference      Peak Flow      Pain Score      Pain Loc      Pain  Edu?      Excl. in GC?    No data found.  Updated Vital Signs BP (!) 157/111   Pulse 99   Temp 98.7 F (37.1 C)   Resp 18   LMP 10/28/2017   SpO2 100%    Physical Exam  Constitutional: She is oriented to person, place, and time. She appears well-developed and well-nourished. No distress.  Cardiovascular: Normal rate, regular rhythm and normal heart sounds.  Pulmonary/Chest: Effort normal and breath sounds normal.  Abdominal: Soft. Bowel sounds are normal. There is no hepatosplenomegaly. There is tenderness in the epigastric area. There is no rigidity, no rebound, no guarding, no CVA tenderness, no tenderness at McBurney's point and negative Murphy's sign.  Genitourinary:  Genitourinary Comments: Patient declined pelvic exam at this time, discussed symptoms and red flag findings at length   Neurological: She is alert and oriented to person, place, and time.  Skin: Skin is warm and dry.     UC Treatments / Results  Labs (all labs ordered are listed, but only abnormal results are displayed) Labs Reviewed  POCT URINALYSIS DIP (DEVICE) - Abnormal; Notable for the following components:      Result Value   Hgb urine dipstick MODERATE (*)    All other  components within normal limits  POCT PREGNANCY, URINE  URINE CYTOLOGY ANCILLARY ONLY    EKG None Radiology No results found.  Procedures Procedures (including critical care time)  Medications Ordered in UC Medications  gi cocktail (Maalox,Lidocaine,Donnatal) (30 mLs Oral Given 11/06/17 2025)     Initial Impression / Assessment and Plan / UC Course  I have reviewed the triage vital signs and the nursing notes.  Pertinent labs & imaging results that were available during my care of the patient were reviewed by me and considered in my medical decision making (see chart for details).     Epigastric pain on exam. Without pelvic pain or other vaginal symptoms, some spotting today only which is unusual for her. Discussed infection as possible cause, urine cytology pending. Will increase norvasc to 10mg  daily due to persistent htn. Daily omeprazole recommended. Encouraged recheck of BP in the next 1-2 weeks, to establish with PCp for BP management. Return precautions provided. Patient states does have improvement of pain s/p gi cocktail in clinic. Patient verbalized understanding and agreeable to plan.    Final Clinical Impressions(s) / UC Diagnoses   Final diagnoses:  Abdominal pain, epigastric  Essential hypertension    ED Discharge Orders        Ordered    amLODipine (NORVASC) 10 MG tablet  Daily     11/06/17 2030    omeprazole (PRILOSEC) 20 MG capsule  Daily     11/06/17 2037       Controlled Substance Prescriptions [place] Controlled Substance Registry consulted? Not Applicable   Georgetta Haber, NP 11/06/17 2039

## 2017-11-09 LAB — URINE CYTOLOGY ANCILLARY ONLY
Chlamydia: NEGATIVE
Neisseria Gonorrhea: NEGATIVE
TRICH (WINDOWPATH): NEGATIVE

## 2017-11-11 LAB — URINE CYTOLOGY ANCILLARY ONLY: Candida vaginitis: NEGATIVE

## 2017-11-12 ENCOUNTER — Telehealth (HOSPITAL_COMMUNITY): Payer: Self-pay

## 2017-11-12 MED ORDER — METRONIDAZOLE 0.75 % VA GEL: 1.0000 | Freq: Two times a day (BID) | VAGINAL | 0 refills | Status: AC

## 2017-11-12 NOTE — Telephone Encounter (Signed)
Bacterial vaginosis is positive. This was not treated at the urgent care visit.  Patient complains of persistent symptoms.  Flagyl vaginal gel 0.75% BID x 7 days #14 no refills sent to patients pharmacy of choice per Dr. Dayton Scrape.  Pt called and made aware of results and new prescription. Answered all questions and pt verbalized understanding.

## 2018-02-28 ENCOUNTER — Encounter (HOSPITAL_COMMUNITY): Payer: Self-pay | Admitting: *Deleted

## 2018-02-28 ENCOUNTER — Emergency Department (HOSPITAL_COMMUNITY)
Admission: EM | Admit: 2018-02-28 | Discharge: 2018-02-28 | Disposition: A | Discharge status: Home / Self Care | Payer: Medicaid Other | Attending: Emergency Medicine | Admitting: Emergency Medicine

## 2018-02-28 ENCOUNTER — Other Ambulatory Visit: Payer: Self-pay

## 2018-02-28 DIAGNOSIS — I10 Essential (primary) hypertension: Secondary | ICD-10-CM | POA: Diagnosis present

## 2018-02-28 DIAGNOSIS — Z79899 Other long term (current) drug therapy: Secondary | ICD-10-CM | POA: Diagnosis not present

## 2018-02-28 LAB — I-STAT CHEM 8, ED
BUN: 11 mg/dL (ref 6–20)
CREATININE: 0.7 mg/dL (ref 0.44–1.00)
Calcium, Ion: 1.22 mmol/L (ref 1.15–1.40)
Chloride: 103 mmol/L (ref 98–111)
GLUCOSE: 85 mg/dL (ref 70–99)
HEMATOCRIT: 40 % (ref 36.0–46.0)
HEMOGLOBIN: 13.6 g/dL (ref 12.0–15.0)
Potassium: 3.7 mmol/L (ref 3.5–5.1)
Sodium: 139 mmol/L (ref 135–145)
TCO2: 25 mmol/L (ref 22–32)

## 2018-02-28 NOTE — ED Triage Notes (Signed)
Pt was at work trying to work a double, felt a little "woozie" bp 142/108 they sent her here for evaluation. Pt is on BP medicine. Took med around 0100. Norvasc 10

## 2018-02-28 NOTE — ED Provider Notes (Signed)
[facility]-EMERGENCY DEPT Provider Note   CSN: [id number] Arrival date & time: 02/28/18  1705     History   Chief Complaint Chief Complaint  Patient presents with  . Hypertension    HPI Christine Young is a 37 y.o. female with past medical history of hypertension on Norvasc, compliant, is here for evaluation of elevated blood pressure reading while at work.  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  She describes as dizziness like feeling off balance.  This lasted approximately 5 minutes and resolved on its own.  Patient states she has been working a lot more hours recently and has felt more tired, she thinks that this is why she felt woozy while at work.  She checked her blood pressure while she was dizzy and it was 142/108, the nurse at the facility told her she should come to the ER.  Has been compliant with Norvasc without missing any doses.  She checks her blood pressure at work every 3 to 4 days and lately it has been in the 150s over 110s range.  She has noticed an increase in her blood pressure lately.  She denies tobacco use.  She denies any current dizziness, lightheadedness.  No associated headache, vision changes, nausea, vomiting, chest pain, shortness of breath, back or abdominal pain, difficulty with walking, loss of sensation numbness or weakness to extremities.  HPI  Past Medical History:  Diagnosis Date  . Hypertension     There are no active problems to display for this patient.   History reviewed. No pertinent surgical history.   OB History    Gravida  7   Para  6   Term      Preterm      AB      Living        SAB      TAB      Ectopic      Multiple      Live Births               Home Medications    Prior to Admission medications   Medication Sig Start Date End Date Taking? Authorizing Provider  amLODipine (NORVASC) 10 MG tablet Take 1 tablet (10 mg total) by mouth daily. 11/06/17    Georgetta HaberBurky, Natalie B, NP  clindamycin (CLEOCIN) 150 MG capsule Take 1 capsule (150 mg total) by mouth 3 (three) times daily. 10/28/17   Wallis BambergMani, Mario, PA-C  clindamycin (CLEOCIN) 300 MG capsule Take 1 capsule (300 mg total) by mouth 3 (three) times daily. 10/28/17   Wallis BambergMani, Mario, PA-C  omeprazole (PRILOSEC) 20 MG capsule Take 1 capsule (20 mg total) by mouth daily. 11/06/17   Georgetta HaberBurky, Natalie B, NP    Family History No family history on file.  Social History Social History   Tobacco Use  . Smoking status: Never Smoker  . Smokeless tobacco: Never Used  Substance Use Topics  . Alcohol use: Yes    Comment: occasionally  . Drug use: Never     Allergies   Penicillins   Review of Systems Review of Systems  Neurological: Positive for dizziness (resolved).  All other systems reviewed and are negative.    Physical Exam Updated Vital Signs BP (!) 129/92   Pulse 78   Temp 99 F (37.2 C) (Oral)   Resp 16   Ht 5\' 7"  (1.702 m)   Wt 88 kg   SpO2 100%   BMI 30.38 kg/m  Physical Exam  Constitutional: She is oriented to person, place, and time. She appears well-developed and well-nourished.  No distress. Non toxic.   HENT:  Head: Normocephalic and atraumatic.  Nose: Nose normal.  Moist mucous membranes. Oropharynx and tonsils normal.   Eyes: Pupils are equal, round, and reactive to light. EOM are normal.  Neck: Neck supple.  Cardiovascular: Normal rate and regular rhythm.  RRR. 2+ DP, radial pulses bilaterally. No LE edema  Pulmonary/Chest: Effort normal and breath sounds normal. No respiratory distress.  Lungs CTAB. No crackles.   Abdominal: Soft. Bowel sounds are normal. There is no tenderness.  No obvious distention.   Neurological: She is alert and oriented to person, place, and time.  Alert and oriented to self, place, time and event.  Speech is fluent without obvious dysarthria or dysphasia. Strength 5/5 with hand grip and ankle F/E.   Sensation to light touch intact in  hands and feet. Normal gait. No pronator drift. No leg drop.  Normal finger-to-nose and finger tapping.  CN I and VIII not tested. CN II-XII grossly intact bilaterally.   Skin: Skin is warm and dry. Capillary refill takes less than 2 seconds.  Psychiatric: She has a normal mood and affect. Her behavior is normal. Judgment and thought content normal.     ED Treatments / Results  Labs (all labs ordered are listed, but only abnormal results are displayed) Labs Reviewed  I-STAT CHEM 8, ED    EKG None  Radiology No results found.  Procedures Procedures (including critical care time)  Medications Ordered in ED Medications - No data to display   Initial Impression / Assessment and Plan / ED Course  I have reviewed the triage vital signs and the nursing notes.  Pertinent labs & imaging results that were available during my care of the patient were reviewed by me and considered in my medical decision making (see chart for details).     37 year old female with history of hypertension compliant with Norvasc here with dizziness that she describes as feeling off balance.  Lasted for 5 minutes during exertional activity and resolved on its own.  On arrival she is asymptomatic.  Noted to have elevated blood pressure reading during dizzy episode however she admits her blood pressure has been slowly increasing into the 150s/110s.  No other associated symptoms.  She denies symptoms at the mimetic drug use.  No clinical features to suggest endorgan dysfunction such as thunderclap headache, stroke symptoms, vision changes, chest pain or shortness of breath, cough, orthopnea, lower extremity edema,\abdominal pain, hematuria.  Her creatinine was normal.  Her blood pressure has normalized and remained stable.  She remains asymptomatic.  Her neurological exam is normal.  Given this, I do not think further emergent aggressive BP control or emergent work-up is indicated. I will keep dose of norvasc as is,  her BP are 130s/90s in ER.  Will dc with f/u with PCP within one week for recheck of BP, may need med adjustment.  Recommended frequent BP readings to determine baseline and facilitate med adjustment by PCP. Chart and available pertinent old records, if available, reviewed by me. Imaging and labs in ER viewed and interpreted by me and used in the medical decision making with formal interpretation from radiologist. Discharge home in stable condition, return precautions discussed.  Patient agreeable with plan for discharge home. Final Clinical Impressions(s) / ED Diagnoses   Final diagnoses:  Elevated blood pressure reading with diagnosis of hypertension    ED Discharge Orders  None       Jerrell Mylar 02/28/18 Maren Beach, MD 02/28/18 2033

## 2018-02-28 NOTE — Discharge Instructions (Addendum)
You were seen in the ER for dizziness and elevated blood pressure reading.   Your blood pressure normalized in the ER  Your kidney function was normal  I suspect your dizziness may have been from fatigue, exertion, mild dehydration.    Stay well hydrated. Get some rest. Continue taking norvasc.   Check your blood pressure daily for the next week to determine your baseline.  Follow up with your doctor if your blood pressure is consistently greater than 140/90  Return to the ER for severe, sudden headache, vision changes, nausea, vomiting, chest pain, shortness of breath, leg swelling, dizziness, one sided numbness heaviness or drooping, difficulty with speech or walking

## 2018-05-31 ENCOUNTER — Emergency Department (HOSPITAL_COMMUNITY)
Admission: EM | Admit: 2018-05-31 | Discharge: 2018-05-31 | Disposition: A | Discharge status: Home / Self Care | Payer: Medicaid Other | Attending: Emergency Medicine | Admitting: Emergency Medicine

## 2018-05-31 ENCOUNTER — Other Ambulatory Visit: Payer: Self-pay

## 2018-05-31 ENCOUNTER — Emergency Department (HOSPITAL_COMMUNITY): Payer: Medicaid Other

## 2018-05-31 ENCOUNTER — Encounter (HOSPITAL_COMMUNITY): Payer: Self-pay | Admitting: *Deleted

## 2018-05-31 DIAGNOSIS — W19XXXA Unspecified fall, initial encounter: Secondary | ICD-10-CM | POA: Diagnosis not present

## 2018-05-31 DIAGNOSIS — Z79899 Other long term (current) drug therapy: Secondary | ICD-10-CM | POA: Diagnosis not present

## 2018-05-31 DIAGNOSIS — I10 Essential (primary) hypertension: Secondary | ICD-10-CM | POA: Insufficient documentation

## 2018-05-31 DIAGNOSIS — Y929 Unspecified place or not applicable: Secondary | ICD-10-CM | POA: Insufficient documentation

## 2018-05-31 DIAGNOSIS — S8392XA Sprain of unspecified site of left knee, initial encounter: Secondary | ICD-10-CM | POA: Diagnosis not present

## 2018-05-31 DIAGNOSIS — S20212A Contusion of left front wall of thorax, initial encounter: Secondary | ICD-10-CM | POA: Diagnosis not present

## 2018-05-31 DIAGNOSIS — Y998 Other external cause status: Secondary | ICD-10-CM | POA: Insufficient documentation

## 2018-05-31 DIAGNOSIS — S8992XA Unspecified injury of left lower leg, initial encounter: Secondary | ICD-10-CM | POA: Diagnosis present

## 2018-05-31 DIAGNOSIS — Y939 Activity, unspecified: Secondary | ICD-10-CM | POA: Insufficient documentation

## 2018-05-31 MED ORDER — IBUPROFEN 600 MG PO TABS: 600.0000 mg | ORAL_TABLET | Freq: Four times a day (QID) | ORAL | 0 refills | Status: DC | PRN

## 2018-05-31 MED ORDER — IBUPROFEN 800 MG PO TABS
800.0000 mg | ORAL_TABLET | Freq: Once | ORAL | Status: DC
  Filled 2018-05-31: qty 1

## 2018-05-31 MED ORDER — ACETAMINOPHEN 500 MG PO TABS
1000.0000 mg | ORAL_TABLET | Freq: Once | ORAL | Status: AC
  Administered 2018-05-31: 1000 mg via ORAL
  Filled 2018-05-31: qty 2

## 2018-05-31 NOTE — ED Triage Notes (Signed)
Pt reported she wanted tylenol but did not like to take pills.

## 2018-05-31 NOTE — ED Provider Notes (Signed)
[facility] EMERGENCY DEPARTMENT Provider Note   CSN: [id number] Arrival date & time: 05/31/18  1555     History   Chief Complaint Chief Complaint  Patient presents with  . Fall    HPI Christine Young is a 37 y.o. female.  HPI  37 y/o female - had a fall a 2 days ago when she was drinking heavily - the pt states that she doesn't remember the fall - but remembers the L knee pain and L rib pain - no SOB or pain with breathing - pain with ambulating and has a "crunch in the knee - sx are persitent mild with rest - moderate with walking - no associated swelling or redness to the skin.  Denies head injury or neck pain and no numbness or weakness.  Past Medical History:  Diagnosis Date  . Hypertension     There are no active problems to display for this patient.   History reviewed. No pertinent surgical history.   OB History    Gravida  7   Para  6   Term      Preterm      AB      Living        SAB      TAB      Ectopic      Multiple      Live Births               Home Medications    Prior to Admission medications   Medication Sig Start Date End Date Taking? Authorizing Provider  amLODipine (NORVASC) 10 MG tablet Take 1 tablet (10 mg total) by mouth daily. 11/06/17   Georgetta Haber, NP  clindamycin (CLEOCIN) 150 MG capsule Take 1 capsule (150 mg total) by mouth 3 (three) times daily. 10/28/17   Wallis Bamberg, PA-C  clindamycin (CLEOCIN) 300 MG capsule Take 1 capsule (300 mg total) by mouth 3 (three) times daily. 10/28/17   Wallis Bamberg, PA-C  ibuprofen (ADVIL,MOTRIN) 600 MG tablet Take 1 tablet (600 mg total) by mouth every 6 (six) hours as needed. 05/31/18   Eber Hong, MD  omeprazole (PRILOSEC) 20 MG capsule Take 1 capsule (20 mg total) by mouth daily. 11/06/17   Georgetta Haber, NP    Family History History reviewed. No pertinent family history.  Social History Social History   Tobacco Use  . Smoking status: Never Smoker  .  Smokeless tobacco: Never Used  Substance Use Topics  . Alcohol use: Yes    Comment: occasionally  . Drug use: Never     Allergies   Other and Penicillins   Review of Systems Review of Systems  Cardiovascular: Positive for chest pain ( rib pain).  Gastrointestinal: Negative for abdominal pain, nausea and vomiting.  Musculoskeletal: Positive for arthralgias. Negative for back pain, joint swelling, neck pain and neck stiffness.  Skin: Negative for rash and wound.  Neurological: Negative for weakness and numbness.  Hematological: Does not bruise/bleed easily.     Physical Exam Updated Vital Signs BP 122/80 (BP Location: Right Arm)   Pulse 69   Temp 98.4 F (36.9 C) (Oral)   Resp 18   Ht 1.702 m (5\' 7" )   Wt 88 kg   LMP 05/15/2018   SpO2 100%   BMI 30.38 kg/m   Physical Exam  Constitutional: She appears well-developed and well-nourished. No distress.  HENT:  Head: Normocephalic and atraumatic.  Mouth/Throat: Oropharynx is clear and moist. No  oropharyngeal exudate.  Eyes: Pupils are equal, round, and reactive to light. Conjunctivae and EOM are normal. Right eye exhibits no discharge. Left eye exhibits no discharge. No scleral icterus.  Neck: Normal range of motion. Neck supple. No JVD present. No thyromegaly present.  Cardiovascular: Normal rate, regular rhythm, normal heart sounds and intact distal pulses. Exam reveals no gallop and no friction rub.  No murmur heard. Pulmonary/Chest: Effort normal and breath sounds normal. No respiratory distress. She has no wheezes. She has no rales. She exhibits tenderness ( posterior L chest wall, no crepitance or sub Q emphysema).  Abdominal: Soft. Bowel sounds are normal. She exhibits no distension and no mass. There is no tenderness.  Musculoskeletal: Normal range of motion. She exhibits no edema or tenderness ( with ROM of the L knee.  no crepitance - no effusion, no ttp over the patella).  Lymphadenopathy:    She has no cervical  adenopathy.  Neurological: She is alert. Coordination normal.  Slightly antalgic gait  Skin: Skin is warm and dry. No rash noted. No erythema.  Psychiatric: She has a normal mood and affect. Her behavior is normal.  Nursing note and vitals reviewed.    ED Treatments / Results  Labs (all labs ordered are listed, but only abnormal results are displayed) Labs Reviewed - No data to display  EKG None  Radiology Dg Ribs Unilateral W/chest Left  Result Date: 05/31/2018 CLINICAL DATA:  37 year old female with recent fall. Rib and knee pain. EXAM: LEFT RIBS AND CHEST - 3+ VIEW COMPARISON:  Chest radiograph 01/15/2016 and earlier. FINDINGS: Lung volumes and mediastinal contours remain normal. Visualized tracheal air column is within normal limits. Both lungs appear stable in clear. No pneumothorax. Negative visible bowel gas pattern. Bone mineralization is within normal limits. Two oblique views of the left ribs. There are small left greater than right C7 cervical ribs. Twelve thoracic ribs. No left rib fracture identified. Other visible osseous structures appear intact. IMPRESSION: 1. No left rib fracture identified. 2. No cardiopulmonary abnormality. Electronically Signed   By: Odessa Fleming M.D.   On: 05/31/2018 17:34   Dg Knee Complete 4 Views Left  Result Date: 05/31/2018 CLINICAL DATA:  37 year old female status post fall with knee pain. EXAM: LEFT KNEE - COMPLETE 4+ VIEW COMPARISON:  None. FINDINGS: Bone mineralization is within normal limits. No evidence of fracture, dislocation, or joint effusion. No evidence of arthropathy or other focal bone abnormality. Soft tissues are unremarkable. IMPRESSION: Negative. Electronically Signed   By: Odessa Fleming M.D.   On: 05/31/2018 17:34    Procedures Procedures (including critical care time)  Medications Ordered in ED Medications  ibuprofen (ADVIL,MOTRIN) tablet 800 mg (800 mg Oral Refused 05/31/18 1638)     Initial Impression / Assessment and Plan /  ED Course  I have reviewed the triage vital signs and the nursing notes.  Pertinent labs & imaging results that were available during my care of the patient were reviewed by me and considered in my medical decision making (see chart for details).     Xrays negative for any signs of acute fractures of the knee or injury to the chest.  Patient informed, stable for discharge with anti-inflammatories.  Final Clinical Impressions(s) / ED Diagnoses   Final diagnoses:  Sprain of left knee, unspecified ligament, initial encounter  Rib contusion, left, initial encounter    ED Discharge Orders         Ordered    ibuprofen (ADVIL,MOTRIN) 600 MG tablet  Every  6 hours PRN     05/31/18 1738           Eber HongMiller, Kayani Rapaport, MD 05/31/18 1739

## 2018-05-31 NOTE — Discharge Instructions (Signed)
Your x-rays reveal no signs of broken bones, your ribs look normal, your knee looks normal, you may have a slight sprain of your knee which may take 1 or 2 weeks to heal.  Please take ibuprofen 3 times daily as needed.  You may use a knee brace as needed, ice and elevation is also important.  Emergency department for severe swelling pain or worsening symptoms.  Otherwise follow-up with your doctor in 1 week for a recheck and a possible referral to orthopedics if no improvement

## 2018-05-31 NOTE — ED Triage Notes (Signed)
Pt reports a fall on Saturday and now has pain in Lt knee " like a crunch" and Lt side pain. Pt ambulatory to room from lobby.

## 2018-05-31 NOTE — ED Notes (Signed)
Knee immobilizer applied Positive distal pulse after application.

## 2018-07-27 ENCOUNTER — Other Ambulatory Visit: Payer: Self-pay

## 2018-07-27 ENCOUNTER — Encounter (HOSPITAL_COMMUNITY): Payer: Self-pay

## 2018-07-27 ENCOUNTER — Ambulatory Visit (HOSPITAL_COMMUNITY)
Admission: EM | Admit: 2018-07-27 | Discharge: 2018-07-27 | Disposition: A | Discharge status: Home / Self Care | Payer: Medicaid Other | Attending: Family Medicine | Admitting: Family Medicine

## 2018-07-27 DIAGNOSIS — K047 Periapical abscess without sinus: Secondary | ICD-10-CM | POA: Insufficient documentation

## 2018-07-27 MED ORDER — HYDROCODONE-ACETAMINOPHEN 5-325 MG PO TABS: 1.0000 | ORAL_TABLET | Freq: Four times a day (QID) | ORAL | 0 refills | Status: AC | PRN

## 2018-07-27 MED ORDER — CLINDAMYCIN HCL 300 MG PO CAPS: 300.0000 mg | ORAL_CAPSULE | Freq: Three times a day (TID) | ORAL | 0 refills | Status: AC

## 2018-07-27 NOTE — ED Provider Notes (Signed)
[facility]    CSN: [id number] Arrival date & time: 07/27/18  0847     History   Chief Complaint Chief Complaint  Patient presents with  . Dental Pain    HPI Christine Young is a 38 y.o. female.   Is a 38 year old woman who presents with dental pain.  She says she has a broken tooth.  She had a similar problem last April and was treated with clindamycin.  At that time she ended up having the infected teeth extracted.  Patient's been up all night with pain.  Patient works as a Lawyer.  She has a 68-year-old daughter in tow today.     Past Medical History:  Diagnosis Date  . Hypertension     There are no active problems to display for this patient.   History reviewed. No pertinent surgical history.  OB History    Gravida  7   Para  6   Term      Preterm      AB      Living        SAB      TAB      Ectopic      Multiple      Live Births               Home Medications    Prior to Admission medications   Medication Sig Start Date End Date Taking? Authorizing Provider  amLODipine (NORVASC) 10 MG tablet Take 1 tablet (10 mg total) by mouth daily. 11/06/17   Georgetta Haber, NP  clindamycin (CLEOCIN) 300 MG capsule Take 1 capsule (300 mg total) by mouth 3 (three) times daily. 07/27/18   Elvina Sidle, MD  HYDROcodone-acetaminophen (NORCO) 5-325 MG tablet Take 1 tablet by mouth every 6 (six) hours as needed for moderate pain. 07/27/18   Elvina Sidle, MD    Family History Family History  Problem Relation Age of Onset  . Hypertension Mother   . Heart failure Mother     Social History Social History   Tobacco Use  . Smoking status: Never Smoker  . Smokeless tobacco: Never Used  Substance Use Topics  . Alcohol use: Yes    Comment: occasionally  . Drug use: Never     Allergies   Other and Penicillins   Review of Systems Review of Systems   Physical Exam Triage Vital Signs ED Triage Vitals  Enc Vitals Group     BP  07/27/18 0958 137/90     Pulse Rate 07/27/18 0958 68     Resp 07/27/18 0958 18     Temp 07/27/18 0958 97.9 F (36.6 C)     Temp Source 07/27/18 0958 Tympanic     SpO2 07/27/18 0958 99 %     Weight 07/27/18 0953 194 lb 6.4 oz (88.2 kg)     Height --      Head Circumference --      Peak Flow --      Pain Score 07/27/18 0952 10     Pain Loc --      Pain Edu? --      Excl. in GC? --    No data found.  Updated Vital Signs BP 137/90 (BP Location: Right Arm)   Pulse 68   Temp 97.9 F (36.6 C) (Tympanic)   Resp 18   Wt 88.2 kg   LMP 07/27/2018   SpO2 99%   BMI 30.45 kg/m   Visual Acuity Right Eye  Distance:   Left Eye Distance:   Bilateral Distance:    Right Eye Near:   Left Eye Near:    Bilateral Near:     Physical Exam Vitals signs and nursing note reviewed.  Constitutional:      Appearance: Normal appearance. She is normal weight.  HENT:     Head: Normocephalic.     Right Ear: External ear normal.     Left Ear: External ear normal.     Nose: Nose normal.     Mouth/Throat:     Mouth: Mucous membranes are moist.     Comments: Large swelling at where the tooth #30 should be.  There is no tooth there now. Eyes:     Conjunctiva/sclera: Conjunctivae normal.  Pulmonary:     Effort: Pulmonary effort is normal.  Musculoskeletal: Normal range of motion.  Skin:    General: Skin is warm and dry.  Neurological:     General: No focal deficit present.     Mental Status: She is alert.  Psychiatric:        Mood and Affect: Mood normal.      UC Treatments / Results  Labs (all labs ordered are listed, but only abnormal results are displayed) Labs Reviewed - No data to display  EKG None  Radiology No results found.  Procedures Procedures (including critical care time)  Medications Ordered in UC Medications - No data to display  Initial Impression / Assessment and Plan / UC Course  I have reviewed the triage vital signs and the nursing notes.  Pertinent  labs & imaging results that were available during my care of the patient were reviewed by me and considered in my medical decision making (see chart for details).    Final Clinical Impressions(s) / UC Diagnoses   Final diagnoses:  Dental abscess     Discharge Instructions     This form constitutes a referral to an oral surgeon to have the root of tooth #30 extracted.    ED Prescriptions    Medication Sig Dispense Auth. Provider   clindamycin (CLEOCIN) 300 MG capsule Take 1 capsule (300 mg total) by mouth 3 (three) times daily. 30 capsule Elvina SidleLauenstein, Khilee Hendricksen, MD   HYDROcodone-acetaminophen (NORCO) 5-325 MG tablet Take 1 tablet by mouth every 6 (six) hours as needed for moderate pain. 6 tablet Elvina SidleLauenstein, Beaux Wedemeyer, MD     Controlled Substance Prescriptions [place] Controlled Substance Registry consulted? Not Applicable   Elvina SidleLauenstein, Tenlee Wollin, MD 07/27/18 1020

## 2018-07-27 NOTE — ED Triage Notes (Signed)
Pt cc has a broken tooth pt states he needs an antibiotic.

## 2018-07-27 NOTE — Discharge Instructions (Addendum)
This form constitutes a referral to an oral surgeon to have the root of tooth #30 extracted.

## 2018-08-15 ENCOUNTER — Encounter (HOSPITAL_COMMUNITY): Payer: Self-pay | Admitting: Emergency Medicine

## 2018-08-15 ENCOUNTER — Emergency Department (HOSPITAL_COMMUNITY)
Admission: EM | Admit: 2018-08-15 | Discharge: 2018-08-15 | Disposition: A | Discharge status: Home / Self Care | Payer: Medicaid Other | Attending: Emergency Medicine | Admitting: Emergency Medicine

## 2018-08-15 ENCOUNTER — Emergency Department (HOSPITAL_COMMUNITY): Payer: Medicaid Other

## 2018-08-15 DIAGNOSIS — R102 Pelvic and perineal pain: Secondary | ICD-10-CM | POA: Diagnosis not present

## 2018-08-15 DIAGNOSIS — Z79899 Other long term (current) drug therapy: Secondary | ICD-10-CM | POA: Insufficient documentation

## 2018-08-15 DIAGNOSIS — N926 Irregular menstruation, unspecified: Secondary | ICD-10-CM | POA: Insufficient documentation

## 2018-08-15 DIAGNOSIS — I10 Essential (primary) hypertension: Secondary | ICD-10-CM | POA: Diagnosis not present

## 2018-08-15 LAB — URINALYSIS, ROUTINE W REFLEX MICROSCOPIC
Bilirubin Urine: NEGATIVE
Glucose, UA: NEGATIVE mg/dL
Hgb urine dipstick: NEGATIVE
KETONES UR: NEGATIVE mg/dL
Leukocytes, UA: NEGATIVE
NITRITE: NEGATIVE
PROTEIN: NEGATIVE mg/dL
Specific Gravity, Urine: 1.008 (ref 1.005–1.030)
pH: 7 (ref 5.0–8.0)

## 2018-08-15 LAB — I-STAT BETA HCG BLOOD, ED (MC, WL, AP ONLY)

## 2018-08-15 NOTE — ED Triage Notes (Signed)
Pt c/o nausea and pelvic pain. States she feels like she is pregnant, but she has had tubal ligation. Pt's LMP: 07/24/2018.

## 2018-08-15 NOTE — Discharge Instructions (Signed)
The testing today did not show any serious problems associated with the pain which you are having.  You continue to have pain it is important to follow-up with a primary care doctor for further evaluation and treatment as soon as possible.

## 2018-08-15 NOTE — ED Provider Notes (Signed)
[facility]-EMERGENCY DEPT Provider Note   CSN: [id number] Arrival date & time: 08/15/18  1013     History   Chief Complaint Chief Complaint  Patient presents with  . Nausea  . Pelvic Pain    HPI Christine Young is a 38 y.o. female.  HPI  She presents for evaluation of left lower abdominal pain.  She denies vaginal discharge or bleeding.  She had an irregular period about 2 weeks ago, initially normal, stop then re-bled for a couple days.  She does not think she is pregnant, she has had a bilateral tubal ligation.  No prior similar problems.  No other menstrual irregularities.  She denies fever, chills, nausea, vomiting, weakness or dizziness.  There are no other known modifying factors.  Past Medical History:  Diagnosis Date  . Hypertension     There are no active problems to display for this patient.   History reviewed. No pertinent surgical history.   OB History    Gravida  7   Para  6   Term      Preterm      AB      Living        SAB      TAB      Ectopic      Multiple      Live Births               Home Medications    Prior to Admission medications   Medication Sig Start Date End Date Taking? Authorizing Provider  amLODipine (NORVASC) 10 MG tablet Take 1 tablet (10 mg total) by mouth daily. 11/06/17   Georgetta Haber, NP  clindamycin (CLEOCIN) 300 MG capsule Take 1 capsule (300 mg total) by mouth 3 (three) times daily. 07/27/18   Elvina Sidle, MD  HYDROcodone-acetaminophen (NORCO) 5-325 MG tablet Take 1 tablet by mouth every 6 (six) hours as needed for moderate pain. 07/27/18   Elvina Sidle, MD    Family History Family History  Problem Relation Age of Onset  . Hypertension Mother   . Heart failure Mother     Social History Social History   Tobacco Use  . Smoking status: Never Smoker  . Smokeless tobacco: Never Used  Substance Use Topics  . Alcohol use: Yes    Comment: occasionally  . Drug use: Never       Allergies   Other and Penicillins   Review of Systems Review of Systems  All other systems reviewed and are negative.    Physical Exam Updated Vital Signs BP (!) 144/100 (BP Location: Left Arm)   Pulse 72   Temp 97.9 F (36.6 C) (Oral)   Resp 16   LMP 07/24/2018 (Exact Date)   SpO2 100%   Physical Exam Vitals signs and nursing note reviewed.  Constitutional:      Appearance: She is well-developed.  HENT:     Head: Normocephalic and atraumatic.     Right Ear: External ear normal.     Left Ear: External ear normal.  Eyes:     Conjunctiva/sclera: Conjunctivae normal.     Pupils: Pupils are equal, round, and reactive to light.  Neck:     Musculoskeletal: Normal range of motion and neck supple.     Trachea: Phonation normal.  Cardiovascular:     Rate and Rhythm: Normal rate and regular rhythm.     Heart sounds: Normal heart sounds.  Pulmonary:     Effort: Pulmonary effort is  normal.     Breath sounds: Normal breath sounds.  Abdominal:     Palpations: Abdomen is soft.     Tenderness: There is no abdominal tenderness.  Musculoskeletal: Normal range of motion.  Skin:    General: Skin is warm and dry.  Neurological:     Mental Status: She is alert and oriented to person, place, and time.     Cranial Nerves: No cranial nerve deficit.     Sensory: No sensory deficit.     Motor: No abnormal muscle tone.     Coordination: Coordination normal.  Psychiatric:        Behavior: Behavior normal.        Thought Content: Thought content normal.        Judgment: Judgment normal.      ED Treatments / Results  Labs (all labs ordered are listed, but only abnormal results are displayed) Labs Reviewed - No data to display  EKG None  Radiology No results found.  Procedures Procedures (including critical care time)  Medications Ordered in ED Medications - No data to display   Initial Impression / Assessment and Plan / ED Course  I have reviewed the triage  vital signs and the nursing notes.  Pertinent labs & imaging results that were available during my care of the patient were reviewed by me and considered in my medical decision making (see chart for details).  Clinical Course as of Aug 15 1433  Sun Aug 15, 2018  1313 Patient declined to have a pelvic examination and wanted an ultrasound to be evaluated further.   [EW]  1434 Normal  I-Stat beta hCG blood, ED [EW]  1434 Normal  Urinalysis, Routine w reflex microscopic(!) [EW]  1434 No acute abnormalities.  US PELVIC COMPLETE WITH TRANSVAGINAL [EW]    Clinical Course User Index [EW] Mancel Bale, MD     Patient Vitals for the past 24 hrs:  BP Temp Temp src Pulse Resp SpO2  08/15/18 1417 (!) 133/95 - - 80 15 99 %  08/15/18 1047 (!) 144/100 97.9 F (36.6 C) Oral 72 16 100 %    2:36 PM Reevaluation with update and discussion. After initial assessment and treatment, an updated evaluation reveals she remains comfortable has no further complaints.  She is ready to go home.  Findings discussed and questions answered. Mancel Bale   Medical Decision Making: Nonspecific pelvic pain with reassuring evaluation.  Patient is not pregnant.  She declined pelvic exam for sample collection.  Doubt PID, acute gynecologic abnormality or serious bacterial infection.  CRITICAL CARE-no Performed by: Mancel Bale   Nursing Notes Reviewed/ Care Coordinated Applicable Imaging Reviewed Interpretation of Laboratory Data incorporated into ED treatment  The patient appears reasonably screened and/or stabilized for discharge and I doubt any other medical condition or other [facility] requiring further screening, evaluation, or treatment in the ED at this time prior to discharge.  Plan: Home Medications-OTC analgesia of choice; Home Treatments-heat to affected area; return here if the recommended treatment, does not improve the symptoms; Recommended follow up-PCP, PRN     Final Clinical Impressions(s) / ED  Diagnoses   Final diagnoses:  None    ED Discharge Orders    None       Mancel Bale, MD 08/15/18 1436

## 2018-08-16 LAB — HIV ANTIBODY (ROUTINE TESTING W REFLEX): HIV SCREEN 4TH GENERATION: NONREACTIVE

## 2018-08-17 LAB — RPR: RPR Ser Ql: NONREACTIVE

## 2019-03-09 ENCOUNTER — Emergency Department
Admission: EM | Admit: 2019-03-09 | Discharge: 2019-03-09 | Disposition: A | Discharge status: Home / Self Care | Payer: Medicaid Other | Attending: Emergency Medicine | Admitting: Emergency Medicine

## 2019-03-09 ENCOUNTER — Other Ambulatory Visit: Payer: Self-pay

## 2019-03-09 DIAGNOSIS — Z5321 Procedure and treatment not carried out due to patient leaving prior to being seen by health care provider: Secondary | ICD-10-CM | POA: Insufficient documentation

## 2019-03-09 DIAGNOSIS — R002 Palpitations: Secondary | ICD-10-CM | POA: Insufficient documentation

## 2019-03-09 LAB — COMPREHENSIVE METABOLIC PANEL
ALT: 17 U/L (ref 0–44)
AST: 18 U/L (ref 15–41)
Albumin: 4.5 g/dL (ref 3.5–5.0)
Alkaline Phosphatase: 45 U/L (ref 38–126)
Anion gap: 9 (ref 5–15)
BUN: 19 mg/dL (ref 6–20)
CO2: 24 mmol/L (ref 22–32)
Calcium: 9.5 mg/dL (ref 8.9–10.3)
Chloride: 104 mmol/L (ref 98–111)
Creatinine, Ser: 0.86 mg/dL (ref 0.44–1.00)
GFR calc Af Amer: >60 mL/min (ref >60)
GFR calc non Af Amer: >60 mL/min (ref >60)
Glucose, Bld: 122 mg/dL — ABNORMAL HIGH (ref 70–99)
Potassium: 3 mmol/L — ABNORMAL LOW (ref 3.5–5.1)
Sodium: 137 mmol/L (ref 135–145)
Total Bilirubin: 0.8 mg/dL (ref 0.3–1.2)
Total Protein: 8.3 g/dL — ABNORMAL HIGH (ref 6.5–8.1)

## 2019-03-09 LAB — CBC
HCT: 39.9 % (ref 36.0–46.0)
Hemoglobin: 13.2 g/dL (ref 12.0–15.0)
MCH: 29.1 pg (ref 26.0–34.0)
MCHC: 33.1 g/dL (ref 30.0–36.0)
MCV: 87.9 fL (ref 80.0–100.0)
Platelets: 301 10*3/uL (ref 150–400)
RBC: 4.54 MIL/uL (ref 3.87–5.11)
RDW: 13 % (ref 11.5–15.5)
WBC: 9.7 10*3/uL (ref 4.0–10.5)
nRBC: 0 % (ref 0.0–0.2)

## 2019-03-09 LAB — TROPONIN I (HIGH SENSITIVITY): Troponin I (High Sensitivity): <2 ng/L (ref <18)

## 2019-03-09 NOTE — ED Notes (Signed)
No answr when called several times from lobby 

## 2019-03-09 NOTE — ED Notes (Signed)
No answer when called several times from lobby 

## 2019-03-09 NOTE — ED Triage Notes (Signed)
Pt states prior to coming to ED she started having palpitations, hx of the same but states it was panic attacks. States has been calm all day, feels nervous and shaky. States has had intermittent vomiting for few days, and co left sided chest pain.

## 2019-03-10 ENCOUNTER — Telehealth: Payer: Self-pay | Admitting: Emergency Medicine

## 2019-03-10 NOTE — Telephone Encounter (Signed)
Called patient due to lwot to inquire about condition and follow up plans. Says she is doing okay now.  I encouraged her to call her doctor to review her labwork and she agrees to do that.

## 2019-05-31 ENCOUNTER — Other Ambulatory Visit: Payer: Self-pay | Admitting: Family Medicine

## 2019-05-31 DIAGNOSIS — N6459 Other signs and symptoms in breast: Secondary | ICD-10-CM

## 2020-01-09 ENCOUNTER — Other Ambulatory Visit: Payer: Self-pay

## 2021-08-30 ENCOUNTER — Other Ambulatory Visit: Payer: Self-pay

## 2022-02-09 ENCOUNTER — Ambulatory Visit: Admission: EM | Admit: 2022-02-09 | Discharge: 2022-02-09 | Discharge status: Absent without leave | Payer: Self-pay

## 2022-02-09 ENCOUNTER — Encounter: Payer: Self-pay | Admitting: Emergency Medicine

## 2022-02-09 ENCOUNTER — Ambulatory Visit
Admission: EM | Admit: 2022-02-09 | Discharge: 2022-02-09 | Disposition: A | Discharge status: Home / Self Care | Payer: Medicaid Other

## 2022-02-09 DIAGNOSIS — J069 Acute upper respiratory infection, unspecified: Secondary | ICD-10-CM

## 2022-02-09 NOTE — ED Triage Notes (Signed)
Pt is present today with c/o sinus pressure, nasal congestion, and body aches. Pt sx started x2 days ago

## 2022-02-09 NOTE — ED Provider Notes (Signed)
[facility]    CSN: [id number] Arrival date & time: 02/09/22  1141      History   Chief Complaint Chief Complaint  Patient presents with   sinus pressure    Generalized Body Aches   Nasal Congestion    HPI Christine Young is a 41 y.o. female.   Pt complains of nasal congestion, sinus pressure, and body aches that started yesterday.  Denies fever, chills, n/v/d.  She has taken Advil with no relief.  She reports she was around someone yesterday with similar sx.  Left work today due to sx.     Past Medical History:  Diagnosis Date   Hypertension     There are no problems to display for this patient.   History reviewed. No pertinent surgical history.  OB History     Gravida  7   Para  6   Term      Preterm      AB      Living         SAB      IAB      Ectopic      Multiple      Live Births               Home Medications    Prior to Admission medications   Medication Sig Start Date End Date Taking? Authorizing Provider  acetaminophen (TYLENOL) 500 MG tablet Take 1,000 mg by mouth every 6 (six) hours as needed for mild pain.    [provider]  amLODipine (NORVASC) 10 MG tablet Take 1 tablet (10 mg total) by mouth daily. 11/06/17   Georgetta Haber, NP  clindamycin (CLEOCIN) 300 MG capsule Take 1 capsule (300 mg total) by mouth 3 (three) times daily. Patient not taking: Reported on 08/15/2018 07/27/18   Elvina Sidle, MD  HYDROcodone-acetaminophen (NORCO) 5-325 MG tablet Take 1 tablet by mouth every 6 (six) hours as needed for moderate pain. Patient not taking: Reported on 08/15/2018 07/27/18   Elvina Sidle, MD  Multiple Vitamin (MULTIVITAMIN WITH MINERALS) TABS tablet Take 1 tablet by mouth daily.    [provider]    Family History Family History  Problem Relation Age of Onset   Hypertension Mother    Heart failure Mother     Social History Social History   Tobacco Use   Smoking status: Never    Smokeless tobacco: Never  Substance Use Topics   Alcohol use: Yes    Comment: occasionally   Drug use: Never     Allergies   Other, Peanut-containing drug products, and Penicillins   Review of Systems Review of Systems  Constitutional:  Negative for chills and fever.  HENT:  Positive for congestion, sinus pressure and sinus pain. Negative for ear pain and sore throat.   Eyes:  Negative for pain and visual disturbance.  Respiratory:  Negative for cough and shortness of breath.   Cardiovascular:  Negative for chest pain and palpitations.  Gastrointestinal:  Negative for abdominal pain and vomiting.  Genitourinary:  Negative for dysuria and hematuria.  Musculoskeletal:  Negative for arthralgias and back pain.  Skin:  Negative for color change and rash.  Neurological:  Negative for seizures and syncope.  All other systems reviewed and are negative.    Physical Exam Triage Vital Signs ED Triage Vitals  Enc Vitals Group     BP 02/09/22 1209 (!) 144/96     Pulse Rate 02/09/22 1209 93  Resp 02/09/22 1209 17     Temp 02/09/22 1209 98 F (36.7 C)     Temp src --      SpO2 02/09/22 1209 97 %     Weight --      Height --      Head Circumference --      Peak Flow --      Pain Score 02/09/22 1208 0     Pain Loc --      Pain Edu? --      Excl. in GC? --    No data found.  Updated Vital Signs BP (!) 144/96   Pulse 93   Temp 98 F (36.7 C)   Resp 17   SpO2 97%   Breastfeeding No   Visual Acuity Right Eye Distance:   Left Eye Distance:   Bilateral Distance:    Right Eye Near:   Left Eye Near:    Bilateral Near:     Physical Exam Vitals and nursing note reviewed.  Constitutional:      General: She is not in acute distress.    Appearance: She is well-developed.  HENT:     Head: Normocephalic and atraumatic.  Eyes:     Conjunctiva/sclera: Conjunctivae normal.  Cardiovascular:     Rate and Rhythm: Normal rate and regular rhythm.     Heart sounds: No murmur  heard. Pulmonary:     Effort: Pulmonary effort is normal. No respiratory distress.     Breath sounds: Normal breath sounds.  Abdominal:     Palpations: Abdomen is soft.     Tenderness: There is no abdominal tenderness.  Musculoskeletal:        General: No swelling.     Cervical back: Neck supple.  Skin:    General: Skin is warm and dry.     Capillary Refill: Capillary refill takes less than 2 seconds.  Neurological:     Mental Status: She is alert.  Psychiatric:        Mood and Affect: Mood normal.      UC Treatments / Results  Labs (all labs ordered are listed, but only abnormal results are displayed) Labs Reviewed - No data to display  EKG   Radiology No results found.  Procedures Procedures (including critical care time)  Medications Ordered in UC Medications - No data to display  Initial Impression / Assessment and Plan / UC Course  I have reviewed the triage vital signs and the nursing notes.  Pertinent labs & imaging results that were available during my care of the patient were reviewed by me and considered in my medical decision making (see chart for details).     Viral URI, pt overall well appearing, no acute distress.  Supportive care discussed. Return precautions discussed. Work note provided.  Final Clinical Impressions(s) / UC Diagnoses   Final diagnoses:  Acute upper respiratory infection     Discharge Instructions      Recommend Flonase and Mucinex Recommend Delsym if you develop a cough Drink plenty of fluids Rest Return if symptoms become worse.    ED Prescriptions   None    PDMP not reviewed this encounter.   Ward, Tylene Fantasia, PA-C 02/09/22 1240

## 2022-02-09 NOTE — Discharge Instructions (Addendum)
Recommend Flonase and Mucinex Recommend Delsym if you develop a cough Drink plenty of fluids Rest Return if symptoms become worse.

## 2022-02-10 ENCOUNTER — Encounter: Payer: Self-pay | Admitting: Emergency Medicine

## 2022-04-03 ENCOUNTER — Other Ambulatory Visit: Payer: Self-pay

## 2022-04-03 ENCOUNTER — Ambulatory Visit
Admission: EM | Admit: 2022-04-03 | Discharge: 2022-04-03 | Disposition: A | Discharge status: Home / Self Care | Payer: Medicaid Other | Attending: Physician Assistant | Admitting: Physician Assistant

## 2022-04-03 ENCOUNTER — Encounter: Payer: Self-pay | Admitting: Emergency Medicine

## 2022-04-03 DIAGNOSIS — U071 COVID-19: Secondary | ICD-10-CM | POA: Diagnosis present

## 2022-04-03 DIAGNOSIS — R051 Acute cough: Secondary | ICD-10-CM | POA: Diagnosis not present

## 2022-04-03 DIAGNOSIS — J069 Acute upper respiratory infection, unspecified: Secondary | ICD-10-CM

## 2022-04-03 LAB — RESP PANEL BY RT-PCR (FLU A&B, COVID) ARPGX2
Influenza A by PCR: NEGATIVE
Influenza B by PCR: NEGATIVE
SARS Coronavirus 2 by RT PCR: POSITIVE — AB

## 2022-04-03 MED ORDER — ACETAMINOPHEN 500 MG PO TABS: 1000.0000 mg | ORAL_TABLET | Freq: Four times a day (QID) | ORAL | 0 refills | Status: DC | PRN

## 2022-04-03 MED ORDER — PROMETHAZINE-DM 6.25-15 MG/5ML PO SYRP: 5.0000 mL | ORAL_SOLUTION | Freq: Four times a day (QID) | ORAL | 0 refills | Status: AC | PRN

## 2022-04-03 MED ORDER — CETIRIZINE HCL 10 MG PO TABS: 10.0000 mg | ORAL_TABLET | Freq: Every day | ORAL | 2 refills | Status: AC

## 2022-04-03 NOTE — ED Provider Notes (Signed)
[facility]    CSN: [id number] Arrival date & time: 04/03/22  1550      History   Chief Complaint Chief Complaint  Patient presents with   Cough    HPI Christine Young is a 41 y.o. female.   Patient presents today with a 3-day history of URI symptoms including cough, body aches, nausea, fatigue, malaise, diarrhea, headache.  Denies any chest pain, shortness of breath, vomiting, fever.  Has been using Tylenol without improvement of symptoms.  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  She has not had COVID in the past.  She is confident that she is not pregnant.  Reports all of her children are sick.  She is currently taking doxycycline for a different reason but denies additional antibiotic or steroid use.  She denies history of asthma, COPD, smoking.    Past Medical History:  Diagnosis Date   Hypertension     There are no problems to display for this patient.   History reviewed. No pertinent surgical history.  OB History     Gravida  7   Para  6   Term  0   Preterm  0   AB  0   Living         SAB  0   IAB  0   Ectopic  0   Multiple      Live Births               Home Medications    Prior to Admission medications   Medication Sig Start Date End Date Taking? Authorizing Provider  cetirizine (ZYRTEC ALLERGY) 10 MG tablet Take 1 tablet (10 mg total) by mouth daily. 04/03/22  Yes Keiley Levey K, PA-C  promethazine-dextromethorphan (PROMETHAZINE-DM) 6.25-15 MG/5ML syrup Take 5 mLs by mouth 4 (four) times daily as needed for cough. 04/03/22  Yes Delena Casebeer, Noberto Retort, PA-C  acetaminophen (TYLENOL) 500 MG tablet Take 2 tablets (1,000 mg total) by mouth every 6 (six) hours as needed for mild pain. 04/03/22   Kavonte Bearse, Noberto Retort, PA-C  amLODipine (NORVASC) 10 MG tablet Take 1 tablet (10 mg total) by mouth daily. 11/06/17   Georgetta Haber, NP  clindamycin (CLEOCIN) 300 MG capsule Take 1 capsule (300 mg total) by mouth  3 (three) times daily. Patient not taking: Reported on 08/15/2018 07/27/18   Elvina Sidle, MD  HYDROcodone-acetaminophen (NORCO) 5-325 MG tablet Take 1 tablet by mouth every 6 (six) hours as needed for moderate pain. Patient not taking: Reported on 08/15/2018 07/27/18   Elvina Sidle, MD  Multiple Vitamin (MULTIVITAMIN WITH MINERALS) TABS tablet Take 1 tablet by mouth daily.    [provider]    Family History Family History  Problem Relation Age of Onset   Hypertension Mother    Heart failure Mother     Social History Social History   Tobacco Use   Smoking status: Never   Smokeless tobacco: Never  Substance Use Topics   Alcohol use: Yes    Comment: occasionally   Drug use: Never     Allergies   Other, Peanut-containing drug products, and Penicillins   Review of Systems Review of Systems  Constitutional:  Positive for activity change, appetite change and fatigue. Negative for fever.  HENT:  Positive for congestion and sore throat. Negative for sinus pressure and sneezing.   Respiratory:  Positive for cough. Negative for shortness of breath.   Cardiovascular:  Negative for  chest pain.  Gastrointestinal:  Positive for diarrhea and nausea. Negative for abdominal pain and vomiting.  Neurological:  Positive for headaches. Negative for dizziness and light-headedness.     Physical Exam Triage Vital Signs ED Triage Vitals [04/03/22 1713]  Enc Vitals Group     BP 129/87     Pulse Rate 91     Resp 18     Temp 98.4 F (36.9 C)     Temp Source Oral     SpO2 95 %     Weight      Height      Head Circumference      Peak Flow      Pain Score 6     Pain Loc      Pain Edu?      Excl. in GC?    No data found.  Updated Vital Signs BP 129/87 (BP Location: Right Arm)   Pulse 91   Temp 98.4 F (36.9 C) (Oral)   Resp 18   SpO2 95%   Visual Acuity Right Eye Distance:   Left Eye Distance:   Bilateral Distance:    Right Eye Near:   Left Eye Near:     Bilateral Near:     Physical Exam Vitals reviewed.  Constitutional:      General: She is awake. She is not in acute distress.    Appearance: Normal appearance. She is well-developed. She is not ill-appearing.     Comments: Very pleasant female appears stated age in no acute distress sitting comfortably in exam room  HENT:     Head: Normocephalic and atraumatic.     Right Ear: Tympanic membrane, ear canal and external ear normal. Tympanic membrane is not erythematous or bulging.     Left Ear: Tympanic membrane, ear canal and external ear normal. Tympanic membrane is not erythematous or bulging.     Nose:     Right Sinus: No maxillary sinus tenderness or frontal sinus tenderness.     Left Sinus: No maxillary sinus tenderness or frontal sinus tenderness.     Mouth/Throat:     Pharynx: Uvula midline. No oropharyngeal exudate or posterior oropharyngeal erythema.  Cardiovascular:     Rate and Rhythm: Normal rate and regular rhythm.     Heart sounds: Normal heart sounds, S1 normal and S2 normal. No murmur heard. Pulmonary:     Effort: Pulmonary effort is normal.     Breath sounds: Normal breath sounds. No wheezing, rhonchi or rales.     Comments: Clear to auscultation bilaterally Psychiatric:        Behavior: Behavior is cooperative.      UC Treatments / Results  Labs (all labs ordered are listed, but only abnormal results are displayed) Labs Reviewed  RESP PANEL BY RT-PCR (FLU A&B, COVID) [id number]    EKG   Radiology No results found.  Procedures Procedures (including critical care time)  Medications Ordered in UC Medications - No data to display  Initial Impression / Assessment and Plan / UC Course  I have reviewed the triage vital signs and the nursing notes.  Pertinent labs & imaging results that were available during my care of the patient were reviewed by me and considered in my medical decision making (see chart for details).     Patient is well-appearing,  afebrile, nontoxic, nontachycardic.  She is currently taking antibiotic and given clinical presentation I am concerned for a viral etiology.  Flu/COVID testing were obtained.  Patient does not have a  recent BMP but given she is otherwise healthy with the exception of hypertension that appears well controlled she is unlikely to benefit greatly from Paxlovid so we will defer antivirals for COVID if she is positive.  She was given Tylenol and cetirizine to help manage her symptoms.  Can use over-the-counter medications for additional symptom relief including Mucinex and sinus rinses.  She was given Promethazine DM for cough.  Discussed that this can be sedating and she should not drive or drink alcohol taking it.  If her symptoms do not improve by next week she is to return for reevaluation.  If anything worsens and she has high fever, chest pain, shortness of breath, nausea/vomiting, weakness she needs to go to the emergency room.  Strict return precautions given.  Work excuse note with COVID return to work guidelines based on [employer] recommendations provided during visit.  Final Clinical Impressions(s) / UC Diagnoses   Final diagnoses:  Upper respiratory tract infection, unspecified type  Acute cough     Discharge Instructions      Monitor your MyChart for your results.  Call us if any questions.  Take Tylenol as needed for fever and pain.  Take Zyrtec for cough and congestion.  Take  promethazine DM for cough.  This make you sleepy so do not drive or drink alcohol with taking it.  Rest drink plenty fluid.  If anything worsens or changes please return for reevaluation.     ED Prescriptions     Medication Sig Dispense Auth. Provider   acetaminophen (TYLENOL) 500 MG tablet Take 2 tablets (1,000 mg total) by mouth every 6 (six) hours as needed for mild pain. 90 tablet Ashantia Amaral K, PA-C   cetirizine (ZYRTEC ALLERGY) 10 MG tablet Take 1 tablet (10 mg total) by mouth daily. 30 tablet Lesbia Ottaway K,  PA-C   promethazine-dextromethorphan (PROMETHAZINE-DM) 6.25-15 MG/5ML syrup Take 5 mLs by mouth 4 (four) times daily as needed for cough. 118 mL Chauntae Hults K, PA-C      PDMP not reviewed this encounter.   Terrilee Croak, PA-C 04/03/22 1748

## 2022-04-03 NOTE — Discharge Instructions (Signed)
Monitor your MyChart for your results.  Call us if any questions.  Take Tylenol as needed for fever and pain.  Take Zyrtec for cough and congestion.  Take  promethazine DM for cough.  This make you sleepy so do not drive or drink alcohol with taking it.  Rest drink plenty fluid.  If anything worsens or changes please return for reevaluation.

## 2022-04-03 NOTE — ED Triage Notes (Signed)
Pt here for cough and body aches with nausea  3 days; pt sts negative covid test Tuesday

## 2022-04-04 ENCOUNTER — Telehealth: Payer: Self-pay

## 2022-08-22 ENCOUNTER — Other Ambulatory Visit: Payer: Self-pay

## 2022-08-22 ENCOUNTER — Emergency Department (HOSPITAL_COMMUNITY)
Admission: EM | Admit: 2022-08-22 | Discharge: 2022-08-22 | Disposition: A | Discharge status: Home / Self Care | Payer: Medicaid Other | Attending: Emergency Medicine | Admitting: Emergency Medicine

## 2022-08-22 ENCOUNTER — Emergency Department (HOSPITAL_COMMUNITY): Payer: Medicaid Other

## 2022-08-22 ENCOUNTER — Encounter (HOSPITAL_COMMUNITY): Payer: Self-pay

## 2022-08-22 DIAGNOSIS — R0602 Shortness of breath: Secondary | ICD-10-CM | POA: Diagnosis present

## 2022-08-22 DIAGNOSIS — R051 Acute cough: Secondary | ICD-10-CM

## 2022-08-22 DIAGNOSIS — Z1152 Encounter for screening for COVID-19: Secondary | ICD-10-CM | POA: Diagnosis not present

## 2022-08-22 DIAGNOSIS — Z79899 Other long term (current) drug therapy: Secondary | ICD-10-CM | POA: Diagnosis not present

## 2022-08-22 DIAGNOSIS — Z87891 Personal history of nicotine dependence: Secondary | ICD-10-CM | POA: Diagnosis not present

## 2022-08-22 DIAGNOSIS — E876 Hypokalemia: Secondary | ICD-10-CM | POA: Diagnosis not present

## 2022-08-22 DIAGNOSIS — I10 Essential (primary) hypertension: Secondary | ICD-10-CM | POA: Diagnosis not present

## 2022-08-22 DIAGNOSIS — Z91010 Allergy to peanuts: Secondary | ICD-10-CM | POA: Insufficient documentation

## 2022-08-22 DIAGNOSIS — J069 Acute upper respiratory infection, unspecified: Secondary | ICD-10-CM | POA: Diagnosis not present

## 2022-08-22 LAB — RESP PANEL BY RT-PCR (RSV, FLU A&B, COVID)  RVPGX2
Influenza A by PCR: NEGATIVE
Influenza B by PCR: NEGATIVE
Resp Syncytial Virus by PCR: NEGATIVE
SARS Coronavirus 2 by RT PCR: NEGATIVE

## 2022-08-22 LAB — CBC WITH DIFFERENTIAL/PLATELET
Abs Immature Granulocytes: 0.01 10*3/uL (ref 0.00–0.07)
Basophils Absolute: 0 10*3/uL (ref 0.0–0.1)
Basophils Relative: 1 %
Eosinophils Absolute: 0.4 10*3/uL (ref 0.0–0.5)
Eosinophils Relative: 5 %
HCT: 40.8 % (ref 36.0–46.0)
Hemoglobin: 13 g/dL (ref 12.0–15.0)
Immature Granulocytes: 0 %
Lymphocytes Relative: 30 %
Lymphs Abs: 2.2 10*3/uL (ref 0.7–4.0)
MCH: 29 pg (ref 26.0–34.0)
MCHC: 31.9 g/dL (ref 30.0–36.0)
MCV: 90.9 fL (ref 80.0–100.0)
Monocytes Absolute: 0.7 10*3/uL (ref 0.1–1.0)
Monocytes Relative: 10 %
Neutro Abs: 3.9 10*3/uL (ref 1.7–7.7)
Neutrophils Relative %: 54 %
Platelets: 335 10*3/uL (ref 150–400)
RBC: 4.49 MIL/uL (ref 3.87–5.11)
RDW: 13.4 % (ref 11.5–15.5)
WBC: 7.3 10*3/uL (ref 4.0–10.5)
nRBC: 0 % (ref 0.0–0.2)

## 2022-08-22 LAB — BASIC METABOLIC PANEL
Anion gap: 10 (ref 5–15)
BUN: 7 mg/dL (ref 6–20)
CO2: 23 mmol/L (ref 22–32)
Calcium: 8.7 mg/dL — ABNORMAL LOW (ref 8.9–10.3)
Chloride: 104 mmol/L (ref 98–111)
Creatinine, Ser: 0.79 mg/dL (ref 0.44–1.00)
GFR, Estimated: >60 mL/min (ref >60)
Glucose, Bld: 93 mg/dL (ref 70–99)
Potassium: 3.4 mmol/L — ABNORMAL LOW (ref 3.5–5.1)
Sodium: 137 mmol/L (ref 135–145)

## 2022-08-22 LAB — HCG, QUANTITATIVE, PREGNANCY: hCG, Beta Chain, Quant, S: <1 m[IU]/mL (ref <5)

## 2022-08-22 LAB — BRAIN NATRIURETIC PEPTIDE: B Natriuretic Peptide: 19 pg/mL (ref 0.0–100.0)

## 2022-08-22 MED ORDER — ACETAMINOPHEN 325 MG PO TABS: 650.0000 mg | ORAL_TABLET | Freq: Four times a day (QID) | ORAL | 0 refills | Status: AC | PRN

## 2022-08-22 MED ORDER — ALBUTEROL SULFATE HFA 108 (90 BASE) MCG/ACT IN AERS
1.0000 | INHALATION_SPRAY | Freq: Four times a day (QID) | RESPIRATORY_TRACT | Status: DC | PRN
  Administered 2022-08-22: 1 via RESPIRATORY_TRACT
  Filled 2022-08-22: qty 6.7

## 2022-08-22 MED ORDER — IPRATROPIUM-ALBUTEROL 0.5-2.5 (3) MG/3ML IN SOLN
3.0000 mL | Freq: Once | RESPIRATORY_TRACT | Status: AC
  Administered 2022-08-22: 3 mL via RESPIRATORY_TRACT
  Filled 2022-08-22: qty 3

## 2022-08-22 MED ORDER — POTASSIUM CHLORIDE CRYS ER 20 MEQ PO TBCR
40.0000 meq | EXTENDED_RELEASE_TABLET | Freq: Once | ORAL | Status: DC
  Filled 2022-08-22: qty 2

## 2022-08-22 MED ORDER — SODIUM CHLORIDE 0.9 % IV BOLUS
1000.0000 mL | Freq: Once | INTRAVENOUS | Status: AC
  Administered 2022-08-22: 1000 mL via INTRAVENOUS

## 2022-08-22 MED ORDER — OXYMETAZOLINE HCL 0.05 % NA SOLN: 1.0000 | Freq: Two times a day (BID) | NASAL | 0 refills | Status: AC

## 2022-08-22 MED ORDER — GUAIFENESIN 100 MG/5ML PO LIQD
5.0000 mL | ORAL | Status: DC | PRN
  Administered 2022-08-22: 5 mL via ORAL
  Filled 2022-08-22: qty 10

## 2022-08-22 MED ORDER — GUAIFENESIN-DM 100-10 MG/5ML PO SYRP: 5.0000 mL | ORAL_SOLUTION | ORAL | 0 refills | Status: AC | PRN

## 2022-08-22 MED ORDER — KETOROLAC TROMETHAMINE 30 MG/ML IJ SOLN
30.0000 mg | Freq: Once | INTRAMUSCULAR | Status: AC
  Administered 2022-08-22: 30 mg via INTRAVENOUS
  Filled 2022-08-22: qty 1

## 2022-08-22 MED ORDER — ACETAMINOPHEN 500 MG PO TABS
1000.0000 mg | ORAL_TABLET | Freq: Once | ORAL | Status: AC
  Administered 2022-08-22: 1000 mg via ORAL
  Filled 2022-08-22: qty 2

## 2022-08-22 MED ORDER — AEROCHAMBER Z-STAT PLUS/MEDIUM MISC
Status: AC
  Filled 2022-08-22: qty 1

## 2022-08-22 MED ORDER — SALINE SPRAY 0.65 % NA SOLN: 1.0000 | NASAL | 0 refills | Status: AC | PRN

## 2022-08-22 MED ORDER — DEXAMETHASONE SODIUM PHOSPHATE 10 MG/ML IJ SOLN
10.0000 mg | Freq: Once | INTRAMUSCULAR | Status: AC
  Administered 2022-08-22: 10 mg
  Filled 2022-08-22: qty 1

## 2022-08-22 MED ORDER — IBUPROFEN 600 MG PO TABS: 600.0000 mg | ORAL_TABLET | Freq: Four times a day (QID) | ORAL | 0 refills | Status: AC | PRN

## 2022-08-22 NOTE — ED Triage Notes (Signed)
Pt coming from home with c/o increased SOB x3 hours. Pt was seen at urgent care yesterday and dx with strep. Pt reports starting abx yesterday. Per EMS wheezing heard in upper quadrants. Pt received 22m albuterol with EMS. Per pt, she has had on and off fevers.

## 2022-08-22 NOTE — Discharge Instructions (Addendum)
It was a pleasure caring for you today in the emergency department.  Please return to the emergency department for any worsening or worrisome symptoms.  Please continue taking antibiotics that are prescribed by urgent care

## 2022-08-22 NOTE — ED Provider Notes (Signed)
[facility] Provider Note  CSN: [id number] Arrival date & time: 08/22/22 1457  Chief Complaint(s) Shortness of Breath  HPI Christine Young is a 42 y.o. female with past medical history as below, significant for htn who presents to the ED with complaint of cough, uri s/s, dib, poor appetite.  Patient reports 2 days of feeling unwell.  Unsure of sick contacts, diagnosed with strep pharyngitis yesterday urgent care and started on oral antibiotics.  No history of asthma or daily tobacco use but does report secondhand smoke from partner.  Were significantly breathing over the past day, given albuterol by EMS which not significant proved her breathing but did make her feel jittery.  Intermittently productive cough with clear/white sputum, no chest pain but having some mild chest tightness, subjective fevers, body aches.  No history of DVT or PE, no recent travel, no leg swelling Past Medical History Past Medical History:  Diagnosis Date   Hypertension    There are no problems to display for this patient.  Home Medication(s) Prior to Admission medications   Medication Sig Start Date End Date Taking? Authorizing Provider  acetaminophen (TYLENOL) 325 MG tablet Take 2 tablets (650 mg total) by mouth every 6 (six) hours as needed. 08/22/22  Yes Wynona Dove A, DO  guaiFENesin-dextromethorphan (ROBITUSSIN DM) 100-10 MG/5ML syrup Take 5 mLs by mouth every 4 (four) hours as needed for cough. 08/22/22  Yes Wynona Dove A, DO  ibuprofen (ADVIL) 600 MG tablet Take 1 tablet (600 mg total) by mouth every 6 (six) hours as needed. 08/22/22  Yes Jeanell Sparrow, DO  oxymetazoline (AFRIN NASAL SPRAY) 0.05 % nasal spray Place 1 spray into both nostrils 2 (two) times daily for 3 days. 08/22/22 08/25/22 Yes Jeanell Sparrow, DO  sodium chloride (OCEAN) 0.65 % SOLN nasal spray Place 1 spray into both nostrils as needed for congestion. 08/22/22  Yes Wynona Dove A, DO  amLODipine  (NORVASC) 10 MG tablet Take 1 tablet (10 mg total) by mouth daily. 11/06/17   Zigmund Gottron, NP  cetirizine (ZYRTEC ALLERGY) 10 MG tablet Take 1 tablet (10 mg total) by mouth daily. 04/03/22   Raspet, Derry Skill, PA-C  clindamycin (CLEOCIN) 300 MG capsule Take 1 capsule (300 mg total) by mouth 3 (three) times daily. Patient not taking: Reported on 08/15/2018 07/27/18   Robyn Haber, MD  HYDROcodone-acetaminophen (NORCO) 5-325 MG tablet Take 1 tablet by mouth every 6 (six) hours as needed for moderate pain. Patient not taking: Reported on 08/15/2018 07/27/18   Robyn Haber, MD  Multiple Vitamin (MULTIVITAMIN WITH MINERALS) TABS tablet Take 1 tablet by mouth daily.    [provider]  promethazine-dextromethorphan (PROMETHAZINE-DM) 6.25-15 MG/5ML syrup Take 5 mLs by mouth 4 (four) times daily as needed for cough. 04/03/22   Raspet, Derry Skill, PA-C  Past Surgical History History reviewed. No pertinent surgical history. Family History Family History  Problem Relation Age of Onset   Hypertension Mother    Heart failure Mother     Social History Social History   Tobacco Use   Smoking status: Former    Types: Cigarettes   Smokeless tobacco: Never  Substance Use Topics   Alcohol use: Yes    Comment: occasionally   Drug use: Never   Allergies Other, Peanut-containing drug products, and Penicillins  Review of Systems Review of Systems  Constitutional:  Positive for fever. Negative for activity change.  HENT:  Positive for congestion, rhinorrhea and sore throat. Negative for facial swelling and trouble swallowing.   Eyes:  Negative for discharge and redness.  Respiratory:  Positive for cough, chest tightness and shortness of breath.   Cardiovascular:  Negative for chest pain and palpitations.  Gastrointestinal:  Negative for abdominal pain and nausea.   Genitourinary:  Negative for dysuria and flank pain.  Musculoskeletal:  Negative for back pain and gait problem.  Skin:  Negative for pallor and rash.  Neurological:  Negative for syncope and headaches.    Physical Exam Vital Signs  I have reviewed the triage vital signs BP 123/77 (BP Location: Right Arm)   Pulse 90   Temp 98.6 F (37 C) (Oral)   Resp 20   Ht 5' 7"$  (1.702 m)   Wt 94.3 kg   LMP 07/28/2022 (Exact Date)   SpO2 97%   BMI 32.58 kg/m  Physical Exam Vitals and nursing note reviewed.  Constitutional:      General: She is not in acute distress.    Appearance: Normal appearance. She is well-developed.  HENT:     Head: Normocephalic and atraumatic.     Right Ear: External ear normal.     Left Ear: External ear normal.     Nose: Nose normal.     Mouth/Throat:     Mouth: Mucous membranes are moist.     Pharynx: Uvula midline. Posterior oropharyngeal erythema present.     Comments: Erythematous posterior oropharynx, uvula is midline, no obvious tonsillar abscess Eyes:     General: No scleral icterus.       Right eye: No discharge.        Left eye: No discharge.  Cardiovascular:     Rate and Rhythm: Normal rate and regular rhythm.     Pulses: Normal pulses.     Heart sounds: Normal heart sounds.  Pulmonary:     Effort: Pulmonary effort is normal. No tachypnea, accessory muscle usage or respiratory distress.     Breath sounds: Normal breath sounds. No decreased breath sounds or wheezing.  Abdominal:     General: Abdomen is flat.     Palpations: Abdomen is soft.     Tenderness: There is no abdominal tenderness.  Musculoskeletal:     Cervical back: No rigidity.     Right lower leg: No edema.     Left lower leg: No edema.  Skin:    General: Skin is warm and dry.     Capillary Refill: Capillary refill takes less than 2 seconds.  Neurological:     Mental Status: She is alert.  Psychiatric:        Mood and Affect: Mood normal.        Behavior: Behavior normal.      ED Results and Treatments Labs (all labs ordered are listed, but only abnormal results are displayed) Labs Reviewed  BASIC METABOLIC PANEL -  Abnormal; Notable for the following components:      Result Value   Potassium 3.4 (*)    Calcium 8.7 (*)    All other components within normal limits  RESP PANEL BY RT-PCR (RSV, FLU A&B, COVID)  RVPGX2  BRAIN NATRIURETIC PEPTIDE  CBC WITH DIFFERENTIAL/PLATELET  HCG, QUANTITATIVE, PREGNANCY                                                                                                                          Radiology DG Chest Port 1 View  Result Date: 08/22/2022 CLINICAL DATA:  Worsening shortness of breath EXAM: PORTABLE CHEST 1 VIEW COMPARISON:  01/15/2016 FINDINGS: The heart size and mediastinal contours are within normal limits. Both lungs are clear. The visualized skeletal structures are unremarkable. IMPRESSION: No active disease. Electronically Signed   By: Jerilynn Mages.  Shick M.D.   On: 08/22/2022 16:25    Pertinent labs & imaging results that were available during my care of the patient were reviewed by me and considered in my medical decision making (see MDM for details).  Medications Ordered in ED Medications  guaiFENesin (ROBITUSSIN) 100 MG/5ML liquid 5 mL (5 mLs Oral Given 08/22/22 1643)  dexamethasone (DECADRON) injection 10 mg (has no administration in time range)  acetaminophen (TYLENOL) tablet 1,000 mg (has no administration in time range)  albuterol (VENTOLIN HFA) 108 (90 Base) MCG/ACT inhaler 1-2 puff (has no administration in time range)  potassium chloride SA (KLOR-CON M) CR tablet 40 mEq (has no administration in time range)  ipratropium-albuterol (DUONEB) 0.5-2.5 (3) MG/3ML nebulizer solution 3 mL (3 mLs Nebulization Given 08/22/22 1652)  ketorolac (TORADOL) 30 MG/ML injection 30 mg (30 mg Intravenous Given 08/22/22 1643)  sodium chloride 0.9 % bolus 1,000 mL (0 mLs Intravenous Stopped 08/22/22 1757)                                                                                                                                      Procedures Procedures  (including critical care time)  Medical Decision Making / ED Course    Medical Decision Making:    Catharina D Miss Dugal is a 42 y.o. female with past medical history as below, significant for htn who presents to the ED with complaint of cough, uri s/s, dib, poor appetite. . The complaint involves an extensive differential diagnosis and also carries with it a high risk of complications  and morbidity.  Serious etiology was considered. Ddx includes but is not limited to: In my evaluation of this patient's dyspnea my DDx includes, but is not limited to, pneumonia, pulmonary embolism, pneumothorax, pulmonary edema, metabolic acidosis, asthma, COPD, cardiac cause, anemia, anxiety, etc.    Complete initial physical exam performed, notably the patient  was no acute distress, she is coughing on my examination.  Not hypoxic.  HDS.   Lungs are clear bilateral Reviewed and confirmed nursing documentation for past medical history, family history, social history.  Vital signs reviewed.       Reviewed urgent care documentation from yesterday, she was diagnosed with right-sided otitis media and strep pharyngitis.  SHe was started on Omnicef.  She reports her symptoms been ongoing, difficulty breathing worsening.  Worsening cough.  Labs reviewed and are stable, chest x-ray without pneumonia.  Potassium is mildly depleted, replace orally.  She has mildly erythematous posterior oropharynx, it is midline, low suspicion for PTA.  No drooling stridor or trismus, no dysphonia.  Will give patient dose of Decadron in the ED, started on antitussive, analgesia for home, decongestant.  Recommend rehydration, strict return precautions were encouraged, advised follow-up PCP  The patient improved significantly and was discharged in stable condition. Detailed discussions were had with the patient  regarding current findings, and need for close f/u with PCP or on call doctor. The patient has been instructed to return immediately if the symptoms worsen in any way for re-evaluation. Patient verbalized understanding and is in agreement with current care plan. All questions answered prior to discharge.    Additional history obtained: -Additional history obtained from na -External records from outside source obtained and reviewed including: Chart review including previous notes, labs, imaging, consultation notes including prior ED visits, prior urgent care documentation, home medications, prior labs and imaging   Lab Tests: -I ordered, reviewed, and interpreted labs.   The pertinent results include:   Labs Reviewed  BASIC METABOLIC PANEL - Abnormal; Notable for the following components:      Result Value   Potassium 3.4 (*)    Calcium 8.7 (*)    All other components within normal limits  RESP PANEL BY RT-PCR (RSV, FLU A&B, COVID)  RVPGX2  BRAIN NATRIURETIC PEPTIDE  CBC WITH DIFFERENTIAL/PLATELET  HCG, QUANTITATIVE, PREGNANCY    Notable for Labs are stable  EKG   EKG Interpretation  Date/Time:  Friday August 22 2022 16:41:14 EST Ventricular Rate:  82 PR Interval:  118 QRS Duration: 87 QT Interval:  360 QTC Calculation: 421 R Axis:   39 Text Interpretation: Sinus rhythm Borderline short PR interval Interpretation limited secondary to artifact similar to prior Confirmed by Wynona Dove ([id number]) on 08/22/2022 4:53:10 PM         Imaging Studies ordered: I ordered imaging studies including cxr I independently visualized the following imaging with scope of interpretation limited to determining acute life threatening conditions related to emergency care: cxr, which revealed no acute process I independently visualized and interpreted imaging. I agree with the radiologist interpretation   Medicines ordered and prescription drug management: Meds ordered this encounter   Medications   ipratropium-albuterol (DUONEB) 0.5-2.5 (3) MG/3ML nebulizer solution 3 mL   guaiFENesin (ROBITUSSIN) 100 MG/5ML liquid 5 mL   ketorolac (TORADOL) 30 MG/ML injection 30 mg   sodium chloride 0.9 % bolus 1,000 mL   dexamethasone (DECADRON) injection 10 mg    po   acetaminophen (TYLENOL) tablet 1,000 mg   albuterol (VENTOLIN HFA) 108 (90 Base) MCG/ACT  inhaler 1-2 puff   guaiFENesin-dextromethorphan (ROBITUSSIN DM) 100-10 MG/5ML syrup    Sig: Take 5 mLs by mouth every 4 (four) hours as needed for cough.    Dispense:  118 mL    Refill:  0   acetaminophen (TYLENOL) 325 MG tablet    Sig: Take 2 tablets (650 mg total) by mouth every 6 (six) hours as needed.    Dispense:  36 tablet    Refill:  0   ibuprofen (ADVIL) 600 MG tablet    Sig: Take 1 tablet (600 mg total) by mouth every 6 (six) hours as needed.    Dispense:  30 tablet    Refill:  0   oxymetazoline (AFRIN NASAL SPRAY) 0.05 % nasal spray    Sig: Place 1 spray into both nostrils 2 (two) times daily for 3 days.    Dispense:  15 mL    Refill:  0   sodium chloride (OCEAN) 0.65 % SOLN nasal spray    Sig: Place 1 spray into both nostrils as needed for congestion.    Dispense:  44 mL    Refill:  0   potassium chloride SA (KLOR-CON M) CR tablet 40 mEq    -I have reviewed the patients home medicines and have made adjustments as needed   Consultations Obtained: na   Cardiac Monitoring: The patient was maintained on a cardiac monitor.  I personally viewed and interpreted the cardiac monitored which showed an underlying rhythm of: nsr  Social Determinants of Health:  Diagnosis or treatment significantly limited by social determinants of health: former smoker and obesity   Reevaluation: After the interventions noted above, I reevaluated the patient and found that they have improved  Co morbidities that complicate the patient evaluation  Past Medical History:  Diagnosis Date   Hypertension        Dispostion: Disposition decision including need for hospitalization was considered, and patient discharged from emergency department.    Final Clinical Impression(s) / ED Diagnoses Final diagnoses:  Upper respiratory tract infection, unspecified type  Acute cough  Hypokalemia     This chart was dictated using voice recognition software.  Despite best efforts to proofread,  errors can occur which can change the documentation meaning.    Jeanell Sparrow, DO 08/22/22 [phone]

## 2022-11-18 ENCOUNTER — Ambulatory Visit
Admission: EM | Admit: 2022-11-18 | Discharge: 2022-11-18 | Disposition: A | Discharge status: Home / Self Care | Payer: 59 | Attending: Internal Medicine | Admitting: Internal Medicine

## 2022-11-18 DIAGNOSIS — A084 Viral intestinal infection, unspecified: Secondary | ICD-10-CM

## 2022-11-18 DIAGNOSIS — R1084 Generalized abdominal pain: Secondary | ICD-10-CM

## 2022-11-18 LAB — POCT URINALYSIS DIP (MANUAL ENTRY)
Bilirubin, UA: NEGATIVE
Glucose, UA: NEGATIVE mg/dL
Ketones, POC UA: NEGATIVE mg/dL
Leukocytes, UA: NEGATIVE
Nitrite, UA: NEGATIVE
Spec Grav, UA: >=1.03 — AB (ref 1.010–1.025)
Urobilinogen, UA: 0.2 E.U./dL
pH, UA: 6 (ref 5.0–8.0)

## 2022-11-18 MED ORDER — KETOROLAC TROMETHAMINE 15 MG/ML IJ SOLN: 30.0000 mg | Freq: Once | INTRAMUSCULAR | Status: DC

## 2022-11-18 MED ORDER — ONDANSETRON 4 MG PO TBDP
4.0000 mg | ORAL_TABLET | Freq: Once | ORAL | Status: AC
  Administered 2022-11-18: 4 mg via ORAL

## 2022-11-18 MED ORDER — ACETAMINOPHEN 325 MG PO TABS
975.0000 mg | ORAL_TABLET | Freq: Once | ORAL | Status: AC
  Administered 2022-11-18: 975 mg via ORAL

## 2022-11-18 MED ORDER — ONDANSETRON 4 MG PO TBDP: 4.0000 mg | ORAL_TABLET | Freq: Three times a day (TID) | ORAL | 0 refills | Status: AC | PRN

## 2022-11-18 MED ORDER — KETOROLAC TROMETHAMINE 30 MG/ML IJ SOLN: 30.0000 mg | Freq: Once | INTRAMUSCULAR | Status: DC

## 2022-11-18 NOTE — Discharge Instructions (Addendum)

## 2022-11-18 NOTE — ED Provider Notes (Addendum)
[facility]    CSN: [id number] Arrival date & time: 11/18/22  1321      History   Chief Complaint No chief complaint on file.   HPI Christine Young is a 42 y.o. female.   Patient presents to urgent care for evaluation of generalized lower abdominal pain, "whole back pain", nausea, and vomiting that started this morning after she woke up. She has had 2 episodes of non-bloody and non-bilious emesis today and is currently nauseous. Lower abdominal pain is currently 8 out of 10 and described as spasm and sharp pain that is constant. Pain to the back is described as spasms and pain is worse with movement. Denies urinary frequency, urgency, hesitancy, gross hematuria, and dysuria. Denies history of kidney stones and pyelonephritis.  No sick contacts with similar symptoms. Denies URI symptoms, fever, chills, recent intake of foods outside of normal diet, and chance of pregnancy (tubal ligation and LMP 10/28/2022). Last normal bowel movement was this morning and was not loose. No blood or mucous to the stools.  She took some leftover hydrocodone this morning and states this didn't help with pain very much.     Past Medical History:  Diagnosis Date   Hypertension     There are no problems to display for this patient.   History reviewed. No pertinent surgical history.  OB History     Gravida  7   Para  6   Term  0   Preterm  0   AB  0   Living         SAB  0   IAB  0   Ectopic  0   Multiple      Live Births               Home Medications    Prior to Admission medications   Medication Sig Start Date End Date Taking? Authorizing Provider  ondansetron (ZOFRAN-ODT) 4 MG disintegrating tablet Take 1 tablet (4 mg total) by mouth every 8 (eight) hours as needed for nausea or vomiting. 11/18/22  Yes Carlisle Beers, FNP  acetaminophen (TYLENOL) 325 MG tablet Take 2 tablets (650 mg total) by mouth every 6 (six) hours as needed. 08/22/22   Sloan Leiter, DO  amLODipine (NORVASC) 10 MG tablet Take 1 tablet (10 mg total) by mouth daily. 11/06/17   Georgetta Haber, NP  cetirizine (ZYRTEC ALLERGY) 10 MG tablet Take 1 tablet (10 mg total) by mouth daily. 04/03/22   Raspet, Noberto Retort, PA-C  clindamycin (CLEOCIN) 300 MG capsule Take 1 capsule (300 mg total) by mouth 3 (three) times daily. Patient not taking: Reported on 08/15/2018 07/27/18   Elvina Sidle, MD  guaiFENesin-dextromethorphan [facility] DM) 100-10 MG/5ML syrup Take 5 mLs by mouth every 4 (four) hours as needed for cough. 08/22/22   Sloan Leiter, DO  HYDROcodone-acetaminophen (NORCO) 5-325 MG tablet Take 1 tablet by mouth every 6 (six) hours as needed for moderate pain. Patient not taking: Reported on 08/15/2018 07/27/18   Elvina Sidle, MD  ibuprofen (ADVIL) 600 MG tablet Take 1 tablet (600 mg total) by mouth every 6 (six) hours as needed. 08/22/22   Sloan Leiter, DO  Multiple Vitamin (MULTIVITAMIN WITH MINERALS) TABS tablet Take 1 tablet by mouth daily.    [provider]  promethazine-dextromethorphan (PROMETHAZINE-DM) 6.25-15 MG/5ML syrup Take 5 mLs by mouth 4 (four) times daily as needed for cough. 04/03/22   Raspet, Noberto Retort, PA-C  sodium chloride (  OCEAN) 0.65 % SOLN nasal spray Place 1 spray into both nostrils as needed for congestion. 08/22/22   Sloan Leiter, DO    Family History Family History  Problem Relation Age of Onset   Hypertension Mother    Heart failure Mother     Social History Social History   Tobacco Use   Smoking status: Former    Types: Cigarettes   Smokeless tobacco: Never  Substance Use Topics   Alcohol use: Yes    Comment: occasionally   Drug use: Never     Allergies   Other, Peanut-containing drug products, and Penicillins   Review of Systems Review of Systems Per HPI  Physical Exam Triage Vital Signs ED Triage Vitals  Enc Vitals Group     BP 11/18/22 1353 135/84     Pulse Rate 11/18/22 1353 99     Resp 11/18/22 1353 16     Temp  11/18/22 1353 98 F (36.7 C)     Temp Source 11/18/22 1353 Oral     SpO2 11/18/22 1353 98 %     Weight --      Height --      Head Circumference --      Peak Flow --      Pain Score 11/18/22 1355 8     Pain Loc --      Pain Edu? --      Excl. in GC? --    No data found.  Updated Vital Signs BP 135/84 (BP Location: Left Arm)   Pulse 99   Temp 98 F (36.7 C) (Oral)   Resp 16   LMP 10/25/2022 (Approximate)   SpO2 98%   Visual Acuity Right Eye Distance:   Left Eye Distance:   Bilateral Distance:    Right Eye Near:   Left Eye Near:    Bilateral Near:     Physical Exam Vitals and nursing note reviewed.  Constitutional:      Appearance: She is not ill-appearing or toxic-appearing.  HENT:     Head: Normocephalic and atraumatic.     Right Ear: Hearing and external ear normal.     Left Ear: Hearing and external ear normal.     Nose: Nose normal.     Mouth/Throat:     Lips: Pink.  Eyes:     General: Lids are normal. Vision grossly intact. Gaze aligned appropriately.     Extraocular Movements: Extraocular movements intact.     Conjunctiva/sclera: Conjunctivae normal.  Cardiovascular:     Rate and Rhythm: Normal rate and regular rhythm.     Heart sounds: Normal heart sounds, S1 normal and S2 normal.  Pulmonary:     Effort: Pulmonary effort is normal. No respiratory distress.     Breath sounds: Normal breath sounds and air entry.  Abdominal:     General: Abdomen is flat. Bowel sounds are normal.     Palpations: Abdomen is soft.     Tenderness: There is generalized abdominal tenderness. There is no right CVA tenderness, left CVA tenderness or guarding.     Hernia: No hernia is present.  Musculoskeletal:     Cervical back: Neck supple.  Skin:    General: Skin is warm and dry.     Capillary Refill: Capillary refill takes less than 2 seconds.     Findings: No rash.  Neurological:     General: No focal deficit present.     Mental Status: She is alert and oriented to  person, place, and  time. Mental status is at baseline.     Cranial Nerves: No dysarthria or facial asymmetry.  Psychiatric:        Mood and Affect: Mood normal.        Speech: Speech normal.        Behavior: Behavior normal.        Thought Content: Thought content normal.        Judgment: Judgment normal.     UC Treatments / Results  Labs (all labs ordered are listed, but only abnormal results are displayed) Labs Reviewed  POCT URINALYSIS DIP (MANUAL ENTRY) - Abnormal; Notable for the following components:      Result Value   Color, UA brown (*)    Clarity, UA cloudy (*)    Spec Grav, UA >=1.030 (*)    Blood, UA trace-intact (*)    Protein Ur, POC trace (*)    All other components within normal limits    EKG   Radiology No results found.  Procedures Procedures (including critical care time)  Medications Ordered in UC Medications  ketorolac (TORADOL) 15 MG/ML injection 30 mg (has no administration in time range)  ondansetron (ZOFRAN-ODT) disintegrating tablet 4 mg (4 mg Oral Given 11/18/22 1352)    Initial Impression / Assessment and Plan / UC Course  I have reviewed the triage vital signs and the nursing notes.  Pertinent labs & imaging results that were available during my care of the patient were reviewed by me and considered in my medical decision making (see chart for details).   1. Viral gastroenteritis, generalized abdominal pain Presentation is consistent with acute viral gastroenteritis that will likely improve with rest, increased fluid intake, and as needed use of antiemetics. Abdominal exam is without peritoneal signs, patient is nontoxic in appearance, and vitals are hemodynamically stable. Push fluids with water and pedialyte for rehydration. Zofran and Tylenol 975mg  given in clinic for abdominal pain, nausea, and back pain. May use zofran 4mg  ODT every 8 hours as needed for nausea and vomiting. May use tylenol or ibuprofen for abdominal discomfort at home  related to viral illness. Bland/liquid diet recommended for 12-24 hours, then increase diet to normal as tolerated.  Urinalysis is negative for signs of urinary tract infection but does show she is dehydrated. Advised to push fluids as stated above.   Discussed physical exam and available lab work findings in clinic with patient.  Counseled patient regarding appropriate use of medications and potential side effects for all medications recommended or prescribed today. Discussed red flag signs and symptoms of worsening condition,when to call the PCP office, return to urgent care, and when to seek higher level of care in the emergency department. Patient verbalizes understanding and agreement with plan. All questions answered. Patient discharged in stable condition.    Final Clinical Impressions(s) / UC Diagnoses   Final diagnoses:  Viral gastroenteritis  Generalized abdominal pain     Discharge Instructions      Your evaluation suggests that your symptoms are most likely due to viral stomach illness (gastroenteritis) which will improve on its own with rest and fluids in the next few days.   I have prescribed an antinausea medication for you to take at home called Zofran. It is the same medication that we gave you in the office.  You may use tylenol 1,000mg  every 6 hours over the counter as needed for abdominal discomfort related to this virus.   Eat a bland diet for the next 12-24 hours (bananas, rice, white  toast, and applesauce) once you are able to tolerate liquids (broth, etc). These foods are easy for your stomach to digest. Pedialyte can be purchased to help with rehydration. Drink plenty of water.   Please follow up with your primary care provider for further management. Return if you experience worsening or uncontrolled pain, inability to tolerate fluids by mouth, difficulty breathing, fevers 100.24F or greater, recurrent vomiting, or any other concerning symptoms. I hope you feel  better!      ED Prescriptions     Medication Sig Dispense Auth. Provider   ondansetron (ZOFRAN-ODT) 4 MG disintegrating tablet Take 1 tablet (4 mg total) by mouth every 8 (eight) hours as needed for nausea or vomiting. 20 tablet Carlisle Beers, FNP      PDMP not reviewed this encounter.   Carlisle Beers, FNP 11/18/22 1436    Carlisle Beers, FNP 11/18/22 1438

## 2022-11-18 NOTE — ED Triage Notes (Signed)
Pt reports she is having low abdominal pain, nausea, left shoulder pain, hurting to urinate on lower right side, and whole back pain that made it hard for her to get up since this morning.

## 2023-12-27 ENCOUNTER — Emergency Department (HOSPITAL_COMMUNITY)
Admission: EM | Admit: 2023-12-27 | Discharge: 2023-12-27 | Disposition: A | Discharge status: Home / Self Care | Attending: Emergency Medicine | Admitting: Emergency Medicine

## 2023-12-27 ENCOUNTER — Emergency Department (HOSPITAL_COMMUNITY)

## 2023-12-27 ENCOUNTER — Encounter (HOSPITAL_COMMUNITY): Payer: Self-pay

## 2023-12-27 ENCOUNTER — Other Ambulatory Visit: Payer: Self-pay

## 2023-12-27 DIAGNOSIS — R519 Headache, unspecified: Secondary | ICD-10-CM | POA: Diagnosis present

## 2023-12-27 DIAGNOSIS — R110 Nausea: Secondary | ICD-10-CM | POA: Insufficient documentation

## 2023-12-27 MED ORDER — DIPHENHYDRAMINE HCL 50 MG/ML IJ SOLN
25.0000 mg | Freq: Once | INTRAMUSCULAR | Status: AC
  Administered 2023-12-27: 25 mg via INTRAVENOUS
  Filled 2023-12-27: qty 1

## 2023-12-27 MED ORDER — KETOROLAC TROMETHAMINE 30 MG/ML IJ SOLN
30.0000 mg | Freq: Once | INTRAMUSCULAR | Status: DC
  Filled 2023-12-27: qty 1

## 2023-12-27 MED ORDER — MAGNESIUM SULFATE 2 GM/50ML IV SOLN
2.0000 g | Freq: Once | INTRAVENOUS | Status: AC
  Administered 2023-12-27: 2 g via INTRAVENOUS
  Filled 2023-12-27: qty 50

## 2023-12-27 MED ORDER — METOCLOPRAMIDE HCL 5 MG/ML IJ SOLN
10.0000 mg | Freq: Once | INTRAMUSCULAR | Status: AC
  Administered 2023-12-27: 10 mg via INTRAVENOUS
  Filled 2023-12-27: qty 2

## 2023-12-27 MED ORDER — DEXAMETHASONE SODIUM PHOSPHATE 4 MG/ML IJ SOLN
4.0000 mg | Freq: Once | INTRAMUSCULAR | Status: AC
  Administered 2023-12-27: 4 mg via INTRAVENOUS
  Filled 2023-12-27: qty 1

## 2023-12-27 NOTE — ED Triage Notes (Signed)
 Pt reports headache that woke her up yesterday morning and headache has worsened since then. Pt reports nausea and light sensitivity as well. Has hx of HTN.

## 2023-12-27 NOTE — ED Provider Notes (Signed)
 [place] EMERGENC[facility] DEPARTMENT AT [facility] Provider Note   CSN: [id number] Arrival date & time: 12/27/23  4098     Patient presents with: Headache and Nausea   Christine Young is a 43 y.o. female.   The history is provided by the patient.  Headache Pain location:  Frontal Quality:  Dull Radiates to:  Does not radiate Onset quality:  Gradual Duration:  2 days Timing:  Constant Progression:  Worsening Chronicity:  New Context: not activity, not exposure to bright light, not caffeine, not coughing, not defecating, not eating, not stress, not exposure to cold air, not intercourse, not loud noise and not straining   Relieved by:  Nothing Worsened by:  Nothing Ineffective treatments:  None tried Associated symptoms: no abdominal pain, no back pain, no blurred vision, no congestion, no cough, no diarrhea, no dizziness, no drainage, no ear pain, no eye pain, no facial pain, no fatigue, no fever, no focal weakness, no hearing loss, no loss of balance, no myalgias, no nausea, no near-syncope, no neck pain, no neck stiffness, no numbness, no paresthesias, no photophobia, no seizures, no sinus pressure, no sore throat, no swollen glands, no syncope, no tingling, no URI, no visual change, no vomiting and no weakness   Risk factors: no anger        Prior to Admission medications   Medication Sig Start Date End Date Taking? Authorizing Provider  acetaminophen  (TYLENOL ) 325 MG tablet Take 2 tablets (650 mg total) by mouth every 6 (six) hours as needed. 08/22/22   Teddi Favors, DO  amLODipine  (NORVASC ) 10 MG tablet Take 1 tablet (10 mg total) by mouth daily. 11/06/17   Burky, Natalie B, NP  cetirizine  (ZYRTEC  ALLERGY) 10 MG tablet Take 1 tablet (10 mg total) by mouth daily. 04/03/22   Raspet, Erin K, PA-C  clindamycin  (CLEOCIN ) 300 MG capsule Take 1 capsule (300 mg total) by mouth 3 (three) times daily. Patient not taking: Reported on 08/15/2018 07/27/18   Dain Drown, MD   guaiFENesin -dextromethorphan (ROBITUSSIN DM) 100-10 MG/5ML syrup Take 5 mLs by mouth every 4 (four) hours as needed for cough. 08/22/22   Teddi Favors, DO  HYDROcodone -acetaminophen  (NORCO) 5-325 MG tablet Take 1 tablet by mouth every 6 (six) hours as needed for moderate pain. Patient not taking: Reported on 08/15/2018 07/27/18   Dain Drown, MD  ibuprofen  (ADVIL ) 600 MG tablet Take 1 tablet (600 mg total) by mouth every 6 (six) hours as needed. 08/22/22   Teddi Favors, DO  Multiple Vitamin (MULTIVITAMIN WITH MINERALS) TABS tablet Take 1 tablet by mouth daily.    [provider]  ondansetron  (ZOFRAN -ODT) 4 MG disintegrating tablet Take 1 tablet (4 mg total) by mouth every 8 (eight) hours as needed for nausea or vomiting. 11/18/22   Starlene Eaton, FNP  promethazine -dextromethorphan (PROMETHAZINE -DM) 6.25-15 MG/5ML syrup Take 5 mLs by mouth 4 (four) times daily as needed for cough. 04/03/22   Raspet, Erin K, PA-C  sodium chloride  (OCEAN) 0.65 % SOLN nasal spray Place 1 spray into both nostrils as needed for congestion. 08/22/22   Teddi Favors, DO    Allergies: Other, Peanut-containing drug products, and Penicillins    Review of Systems  Constitutional:  Negative for fatigue and fever.  HENT:  Negative for congestion, ear pain, hearing loss, postnasal drip, sinus pressure and sore throat.   Eyes:  Negative for blurred vision, photophobia and pain.  Respiratory:  Negative for cough.   Cardiovascular:  Negative  for syncope and near-syncope.  Gastrointestinal:  Negative for abdominal pain, diarrhea, nausea and vomiting.  Musculoskeletal:  Negative for back pain, myalgias, neck pain and neck stiffness.  Neurological:  Positive for headaches. Negative for dizziness, focal weakness, seizures, weakness, numbness, paresthesias and loss of balance.  All other systems reviewed and are negative.   Updated Vital Signs BP (!) 154/99   Pulse 98   Temp 98.7 F (37.1 C) (Oral)   Resp 17    Ht 5' 7 (1.702 m)   LMP 12/22/2023   SpO2 100%   BMI 32.58 kg/m   Physical Exam Vitals and nursing note reviewed.  Constitutional:      General: She is not in acute distress.    Appearance: Normal appearance. She is well-developed.  HENT:     Head: Normocephalic and atraumatic.     Nose: Nose normal.     Mouth/Throat:     Mouth: Mucous membranes are moist.     Pharynx: Oropharynx is clear.   Eyes:     Extraocular Movements: Extraocular movements intact.     Pupils: Pupils are equal, round, and reactive to light.     Comments: Disk margins sharp intact cognition no proptosis   Cardiovascular:     Rate and Rhythm: Normal rate and regular rhythm.     Pulses: Normal pulses.     Heart sounds: Normal heart sounds.  Pulmonary:     Effort: Pulmonary effort is normal. No respiratory distress.     Breath sounds: Normal breath sounds.  Abdominal:     General: Bowel sounds are normal. There is no distension.     Palpations: Abdomen is soft.     Tenderness: There is no abdominal tenderness. There is no guarding or rebound.  Genitourinary:    Vagina: No vaginal discharge.   Musculoskeletal:        General: Normal range of motion.     Cervical back: Normal range of motion and neck supple.   Skin:    General: Skin is warm and dry.     Capillary Refill: Capillary refill takes less than 2 seconds.     Findings: No erythema or rash.   Neurological:     General: No focal deficit present.     Mental Status: She is alert and oriented to person, place, and time.     Cranial Nerves: No cranial nerve deficit.     Deep Tendon Reflexes: Reflexes normal.   Psychiatric:        Mood and Affect: Mood normal.        Behavior: Behavior normal.     (all labs ordered are listed, but only abnormal results are displayed) Labs Reviewed  PREGNANCY, URINE    EKG: None  Radiology: No results found.   Procedures   Medications  ketorolac  (TORADOL ) 30 MG/ML injection 30 mg (has no  administration in time range)  metoCLOPramide (REGLAN) injection 10 mg (10 mg Intravenous Given 12/27/23 0558)  diphenhydrAMINE (BENADRYL) injection 25 mg (25 mg Intravenous Given 12/27/23 0557)  magnesium sulfate IVPB 2 g 50 mL (0 g Intravenous Stopped 12/27/23 0618)  dexamethasone  (DECADRON ) injection 4 mg (4 mg Intravenous Given 12/27/23 0558)                                    Medical Decision Making Patient with frontal for 2 days, glasses are old and not being used regularly   Amount  and/or Complexity of Data Reviewed Labs: ordered. Radiology: ordered and independent interpretation performed.    Details: Normal head CT   Risk Prescription drug management. Risk Details: Well appearing.  No meningitis.  No ICH.  I do not believe this is a cavernous sinus thrombosis.  Disks are sharp, no proptosis, intact cognition.  Stable for discharge.       Final diagnoses:  None   No signs of systemic illness or infection. The patient is nontoxic-appearing on exam and vital signs are within normal limits.  I have reviewed the triage vital signs and the nursing notes. Pertinent labs & imaging results that were available during my care of the patient were reviewed by me and considered in my medical decision making (see chart for details). After history, exam, and medical workup I feel the patient has been appropriately medically screened and is safe for discharge home. Pertinent diagnoses were discussed with the patient. Patient was given return precautions.  ED Discharge Orders     None          Alysandra Lobue, MD 12/27/23 [phone]

## 2023-12-27 NOTE — ED Notes (Signed)
 Patient transported to CT
